# Patient Record
Sex: Female | Born: 1945 | Race: White | Hispanic: No | Marital: Married | Smoking: Never smoker
Health system: Southern US, Community
[De-identification: ages and names within clinical notes are randomized; demographics above are authoritative.]

## PROBLEM LIST (undated history)

## (undated) DIAGNOSIS — F32A Depression, unspecified: Secondary | ICD-10-CM

## (undated) DIAGNOSIS — G709 Myoneural disorder, unspecified: Secondary | ICD-10-CM

## (undated) DIAGNOSIS — C801 Malignant (primary) neoplasm, unspecified: Secondary | ICD-10-CM

## (undated) DIAGNOSIS — M1990 Unspecified osteoarthritis, unspecified site: Secondary | ICD-10-CM

## (undated) DIAGNOSIS — E785 Hyperlipidemia, unspecified: Secondary | ICD-10-CM

## (undated) DIAGNOSIS — Z9889 Other specified postprocedural states: Secondary | ICD-10-CM

## (undated) DIAGNOSIS — E039 Hypothyroidism, unspecified: Secondary | ICD-10-CM

## (undated) DIAGNOSIS — K219 Gastro-esophageal reflux disease without esophagitis: Secondary | ICD-10-CM

## (undated) DIAGNOSIS — E119 Type 2 diabetes mellitus without complications: Secondary | ICD-10-CM

## (undated) DIAGNOSIS — Z87442 Personal history of urinary calculi: Secondary | ICD-10-CM

## (undated) DIAGNOSIS — R7303 Prediabetes: Secondary | ICD-10-CM

## (undated) DIAGNOSIS — F419 Anxiety disorder, unspecified: Secondary | ICD-10-CM

## (undated) DIAGNOSIS — I10 Essential (primary) hypertension: Secondary | ICD-10-CM

## (undated) DIAGNOSIS — R011 Cardiac murmur, unspecified: Secondary | ICD-10-CM

## (undated) DIAGNOSIS — R112 Nausea with vomiting, unspecified: Secondary | ICD-10-CM

## (undated) HISTORY — PX: TOTAL THYROIDECTOMY: SHX2547

## (undated) HISTORY — PX: BACK SURGERY: SHX140

## (undated) HISTORY — DX: Hyperlipidemia, unspecified: E78.5

## (undated) HISTORY — PX: TONSILLECTOMY: SUR1361

## (undated) HISTORY — PX: EYE SURGERY: SHX253

## (undated) HISTORY — PX: JOINT REPLACEMENT: SHX530

## (undated) HISTORY — PX: APPENDECTOMY: SHX54

## (undated) HISTORY — PX: CHOLECYSTECTOMY: SHX55

## (undated) HISTORY — PX: OTHER SURGICAL HISTORY: SHX169

## (undated) HISTORY — PX: ABDOMINAL HYSTERECTOMY: SHX81

---

## 1976-11-03 DIAGNOSIS — C801 Malignant (primary) neoplasm, unspecified: Secondary | ICD-10-CM

## 1976-11-03 HISTORY — DX: Malignant (primary) neoplasm, unspecified: C80.1

## 1980-11-03 DIAGNOSIS — Z9089 Acquired absence of other organs: Secondary | ICD-10-CM

## 1980-11-03 DIAGNOSIS — E890 Postprocedural hypothyroidism: Secondary | ICD-10-CM

## 1980-11-03 HISTORY — DX: Postprocedural hypothyroidism: E89.0

## 1980-11-03 HISTORY — DX: Acquired absence of other organs: Z90.89

## 2014-11-03 HISTORY — PX: SHOULDER ARTHROSCOPY: SHX128

## 2015-11-06 ENCOUNTER — Ambulatory Visit: Payer: Self-pay | Admitting: Allergy and Immunology

## 2017-03-06 ENCOUNTER — Ambulatory Visit: Payer: Self-pay | Admitting: Physician Assistant

## 2017-03-13 ENCOUNTER — Encounter (HOSPITAL_COMMUNITY)
Admission: RE | Admit: 2017-03-13 | Discharge: 2017-03-13 | Disposition: A | Discharge status: Home / Self Care | Payer: Medicare Other | Source: Ambulatory Visit | Attending: Orthopedic Surgery | Admitting: Orthopedic Surgery

## 2017-03-13 ENCOUNTER — Encounter (HOSPITAL_COMMUNITY): Payer: Self-pay

## 2017-03-13 DIAGNOSIS — Z01818 Encounter for other preprocedural examination: Secondary | ICD-10-CM | POA: Diagnosis not present

## 2017-03-13 DIAGNOSIS — M4722 Other spondylosis with radiculopathy, cervical region: Secondary | ICD-10-CM | POA: Insufficient documentation

## 2017-03-13 HISTORY — DX: Other specified postprocedural states: Z98.890

## 2017-03-13 HISTORY — DX: Essential (primary) hypertension: I10

## 2017-03-13 HISTORY — DX: Malignant (primary) neoplasm, unspecified: C80.1

## 2017-03-13 HISTORY — DX: Type 2 diabetes mellitus without complications: E11.9

## 2017-03-13 HISTORY — DX: Unspecified osteoarthritis, unspecified site: M19.90

## 2017-03-13 HISTORY — DX: Other specified postprocedural states: R11.2

## 2017-03-13 HISTORY — DX: Anxiety disorder, unspecified: F41.9

## 2017-03-13 HISTORY — DX: Cardiac murmur, unspecified: R01.1

## 2017-03-13 HISTORY — DX: Personal history of urinary calculi: Z87.442

## 2017-03-13 LAB — BASIC METABOLIC PANEL
ANION GAP: 10 (ref 5–15)
BUN: 12 mg/dL (ref 6–20)
CALCIUM: 9.3 mg/dL (ref 8.9–10.3)
CO2: 29 mmol/L (ref 22–32)
Chloride: 100 mmol/L — ABNORMAL LOW (ref 101–111)
Creatinine, Ser: 0.97 mg/dL (ref 0.44–1.00)
GFR calc Af Amer: >60 mL/min (ref >60)
GFR, EST NON AFRICAN AMERICAN: 57 mL/min — AB (ref >60)
Glucose, Bld: 116 mg/dL — ABNORMAL HIGH (ref 65–99)
Potassium: 3.2 mmol/L — ABNORMAL LOW (ref 3.5–5.1)
Sodium: 139 mmol/L (ref 135–145)

## 2017-03-13 LAB — GLUCOSE, CAPILLARY: Glucose-Capillary: 106 mg/dL — ABNORMAL HIGH (ref 65–99)

## 2017-03-13 LAB — CBC
HCT: 37.2 % (ref 36.0–46.0)
HEMOGLOBIN: 12.1 g/dL (ref 12.0–15.0)
MCH: 29.4 pg (ref 26.0–34.0)
MCHC: 32.5 g/dL (ref 30.0–36.0)
MCV: 90.3 fL (ref 78.0–100.0)
Platelets: 319 10*3/uL (ref 150–400)
RBC: 4.12 MIL/uL (ref 3.87–5.11)
RDW: 13.3 % (ref 11.5–15.5)
WBC: 7.1 10*3/uL (ref 4.0–10.5)

## 2017-03-13 LAB — SURGICAL PCR SCREEN
MRSA, PCR: NEGATIVE
Staphylococcus aureus: NEGATIVE

## 2017-03-13 NOTE — Progress Notes (Signed)
pT. REPORTS that she is free of chest or breathing concerns.  Pt. Followed with DR. Dia Sitter due to family cardiac history & she also reports she had  A new heart murmur recently so she was evaluated for that.

## 2017-03-13 NOTE — Progress Notes (Signed)
Pt. Followed by [facility]- Dr. Dia Sitter in [place] for family cardiac history & she had a sudden new finding of heart murmur.  She had an aEcho in 2016 & its been requested from the cards office. Pt. Also sees [facility], faxed request to them as well to obtain last HgbA1C. Pt. Denies chest &/or breathing concerns.

## 2017-03-13 NOTE — Pre-Procedure Instructions (Signed)
Heidi Thompson  03/13/2017      [place] [place], [place] [address] Phone: [phone] Fax: [phone]    Your procedure is scheduled on Wednesday, May 16th   Report to [facility] Admitting at 9:30 AM             (posted surgery time 11:27 am - 3:27 pm)   Call this number if you have problems the morning of surgery:  [phone], for other questions, call [phone] Mon-Fr  From 8-4:30pm   Remember:  Do not eat food or drink liquids after midnight Tuesday.              4-5 days prior to surgery, STOP TAKING any Vitamins, Herbal Supplements, Anti-inflammatories   Take these medicines the morning of surgery with A SIP OF WATER : Coreg, Lexapro, Gabapentin, Levothyroxine, Lorazepam, Omeprazole.    Do not wear jewelry, make-up or nail polish.  Do not wear lotions, powders,  perfumes, or deoderant.             Do not shave 48 hrs prior to surgery.   Do not bring valuables to the hospital.  [facility] is not responsible for any belongings or valuables.  Contacts, dentures or bridgework may not be worn into surgery.  Leave your suitcase in the car.  After surgery it may be brought to your room.  For patients admitted to the hospital, discharge time will be determined by your treatment team.   Please read over the following fact sheets that you were given. Pain Booklet, Surgical Site Infection Prevention and Anesthesia Post-op Instructions           How to Manage Your Diabetes Before and After Surgery  Why is it important to control my blood sugar before and after surgery? . Improving blood sugar levels before and after surgery helps healing and can limit problems. . A way of improving blood sugar control is eating a healthy diet by: o  Eating less sugar and carbohydrates o  Increasing activity/exercise o  Talking with your doctor about reaching your blood sugar goals . High blood  sugars (greater than 180 mg/dL) can raise your risk of infections and slow your recovery, so you will need to focus on controlling your diabetes during the weeks before surgery. . Make sure that the doctor who takes care of your diabetes knows about your planned surgery including the date and location.  How do I manage my blood sugar before surgery? . Check your blood sugar at least 4 times a day, starting 2 days before surgery, to make sure that the level is not too high or low. Marland Kitchen  o Check your blood sugar the morning of your surgery when you wake up and every 2 hours until you get to the Short Stay unit. . If your blood sugar is less than 70 mg/dL, you will need to treat for low blood sugar: o Do not take insulin. o Treat a low blood sugar (less than 70 mg/dL) with  cup of clear juice (cranberry or apple), 4 glucose tablets, OR glucose gel. o  o Recheck blood sugar in 15 minutes after treatment (to make sure it is greater than 70 mg/dL). If your blood sugar is not greater than 70 mg/dL on recheck, call [phone] for further instructions. . Report your blood sugar to the short stay nurse when you get to Short Stay.  . If you  are admitted to the hospital after surgery: o Your blood sugar will be checked by the staff and you will probably be given insulin after surgery (instead of oral diabetes medicines) to make sure you have good blood sugar levels. o The goal for blood sugar control after surgery is 80-180 mg/dL.          WHAT DO I DO ABOUT MY DIABETES MEDICATION?   Marland Kitchen Do not take oral diabetes medicines (pills) the morning of surgery.   . The day of surgery, do not take other diabetes injectables, including Byetta (exenatide), Bydureon (exenatide ER), Victoza (liraglutide), or Trulicity (dulaglutide).  . If your CBG is greater than 220 mg/dL, you may take  of your sliding scale (correction) dose of insulin.  Other Instructions:          Patient  Signature:  Date:   Nurse Signature:  Date:   Reviewed and Endorsed by [facility] Patient Education Committee, August 2015

## 2017-03-16 ENCOUNTER — Encounter (HOSPITAL_COMMUNITY): Payer: Self-pay

## 2017-03-16 NOTE — Progress Notes (Signed)
Anesthesia Chart Review:   Pt is a 71 year old female scheduled for C4-7 ACDF on 03/18/2017 with Melina Schools, M.D.   - PCP is [name], [facility] in [place], [place] - Cardiologist is Raechel Chute, MD in [place], [place]; last office visit 01/23/17.   PMH includes: HTN, heart murmur, DM, melanoma (1978) post-op N/V. Never smoker. BMI 29. S/p cholecystectomy, total thyroidectomy, abdominal hysterectomy.   Medications include: Lipitor, carvedilol, chlorthalidone, levothyroxine, metformin, Prilosec, potassium  Preoperative labs reviewed. Glucose 116. HbA1c was 5.4 on 02/12/17 at PCPs office.  EKG 01/23/17: Sinus bradycardia (59 bpm). Low voltage in precordial leads. Old anterior infarct.  Stress echo 11/06/15 [facility]): 1. Clinically negative test is a low level of workload. Note is made to the patient is on chronic beta blocker therapy. 2. Electrocardiographically no evidence of induced ischemia given level of workload. 3. Echocardiogram shows normal LV systolic function at rest with EF of about 60% and normal augmentation of contractility post exercise. Almost to complete cavity obliteration in systole. 4. There is no documented dynamic gradient. 5. LA size grossly normal and RV systolic pressure also appears normal. On the other hand, the patient appears to have a grade 2 diastolic filling pattern with indeterminate LV filling pressures by tissue Doppler analysis.  If no changes, I anticipate pt can proceed with surgery as scheduled.   Willeen Cass, FNP-BC [facility] Short Stay Surgical Center/Anesthesiology Phone: [phone] 03/16/2017 10:58 AM

## 2017-03-18 ENCOUNTER — Ambulatory Visit (HOSPITAL_COMMUNITY): Payer: Medicare Other | Admitting: Emergency Medicine

## 2017-03-18 ENCOUNTER — Ambulatory Visit (HOSPITAL_COMMUNITY): Payer: Medicare Other | Admitting: Certified Registered Nurse Anesthetist

## 2017-03-18 ENCOUNTER — Inpatient Hospital Stay (HOSPITAL_COMMUNITY): Payer: Medicare Other

## 2017-03-18 ENCOUNTER — Inpatient Hospital Stay (HOSPITAL_COMMUNITY)
Admission: RE | Admit: 2017-03-18 | Discharge: 2017-03-19 | DRG: 473 | Disposition: A | Discharge status: Home / Self Care | Payer: Medicare Other | Source: Ambulatory Visit | Attending: Orthopedic Surgery | Admitting: Orthopedic Surgery

## 2017-03-18 ENCOUNTER — Encounter (HOSPITAL_COMMUNITY)
Admission: RE | Disposition: A | Discharge status: Home / Self Care | Payer: Self-pay | Source: Ambulatory Visit | Attending: Orthopedic Surgery

## 2017-03-18 ENCOUNTER — Encounter (HOSPITAL_COMMUNITY): Payer: Self-pay | Admitting: *Deleted

## 2017-03-18 ENCOUNTER — Ambulatory Visit (HOSPITAL_COMMUNITY): Payer: Medicare Other

## 2017-03-18 DIAGNOSIS — Z818 Family history of other mental and behavioral disorders: Secondary | ICD-10-CM

## 2017-03-18 DIAGNOSIS — Z7982 Long term (current) use of aspirin: Secondary | ICD-10-CM

## 2017-03-18 DIAGNOSIS — E119 Type 2 diabetes mellitus without complications: Secondary | ICD-10-CM | POA: Diagnosis present

## 2017-03-18 DIAGNOSIS — Z833 Family history of diabetes mellitus: Secondary | ICD-10-CM | POA: Diagnosis not present

## 2017-03-18 DIAGNOSIS — I10 Essential (primary) hypertension: Secondary | ICD-10-CM | POA: Diagnosis present

## 2017-03-18 DIAGNOSIS — Z85820 Personal history of malignant melanoma of skin: Secondary | ICD-10-CM | POA: Diagnosis not present

## 2017-03-18 DIAGNOSIS — Z981 Arthrodesis status: Secondary | ICD-10-CM

## 2017-03-18 DIAGNOSIS — M50121 Cervical disc disorder at C4-C5 level with radiculopathy: Secondary | ICD-10-CM | POA: Diagnosis present

## 2017-03-18 DIAGNOSIS — Z419 Encounter for procedure for purposes other than remedying health state, unspecified: Secondary | ICD-10-CM

## 2017-03-18 DIAGNOSIS — M542 Cervicalgia: Secondary | ICD-10-CM | POA: Diagnosis present

## 2017-03-18 HISTORY — PX: ANTERIOR CERVICAL DECOMP/DISCECTOMY FUSION: SHX1161

## 2017-03-18 LAB — GLUCOSE, CAPILLARY
Glucose-Capillary: 108 mg/dL — ABNORMAL HIGH (ref 65–99)
Glucose-Capillary: 101 mg/dL — ABNORMAL HIGH (ref 65–99)
Glucose-Capillary: 124 mg/dL — ABNORMAL HIGH (ref 65–99)
Glucose-Capillary: 222 mg/dL — ABNORMAL HIGH (ref 65–99)

## 2017-03-18 SURGERY — ANTERIOR CERVICAL DECOMPRESSION/DISCECTOMY FUSION 2 LEVELS
Anesthesia: General | Site: Neck

## 2017-03-18 MED ORDER — METHOCARBAMOL 500 MG PO TABS
ORAL_TABLET | ORAL | Status: AC
  Filled 2017-03-18: qty 1

## 2017-03-18 MED ORDER — ROCURONIUM BROMIDE 10 MG/ML (PF) SYRINGE
PREFILLED_SYRINGE | INTRAVENOUS | Status: AC
  Filled 2017-03-18: qty 10

## 2017-03-18 MED ORDER — FENTANYL CITRATE (PF) 100 MCG/2ML IJ SOLN: 25.0000 ug | INTRAMUSCULAR | Status: DC | PRN

## 2017-03-18 MED ORDER — PROPOFOL 10 MG/ML IV BOLUS
INTRAVENOUS | Status: DC | PRN
  Administered 2017-03-18: 120 mg via INTRAVENOUS

## 2017-03-18 MED ORDER — ACETAMINOPHEN 325 MG PO TABS: 650.0000 mg | ORAL_TABLET | ORAL | Status: DC | PRN

## 2017-03-18 MED ORDER — BUPIVACAINE-EPINEPHRINE 0.25% -1:200000 IJ SOLN
INTRAMUSCULAR | Status: DC | PRN
  Administered 2017-03-18: 6 mL

## 2017-03-18 MED ORDER — INSULIN ASPART 100 UNIT/ML ~~LOC~~ SOLN: 0.0000 [IU] | Freq: Three times a day (TID) | SUBCUTANEOUS | Status: DC

## 2017-03-18 MED ORDER — LORAZEPAM 0.5 MG PO TABS
1.0000 mg | ORAL_TABLET | Freq: Two times a day (BID) | ORAL | Status: DC
  Administered 2017-03-18: 1 mg via ORAL
  Filled 2017-03-18 (×2): qty 2

## 2017-03-18 MED ORDER — CHLORTHALIDONE 25 MG PO TABS
25.0000 mg | ORAL_TABLET | Freq: Every day | ORAL | Status: DC
  Administered 2017-03-19: 25 mg via ORAL
  Filled 2017-03-18: qty 1

## 2017-03-18 MED ORDER — OMEPRAZOLE MAGNESIUM 20 MG PO TBEC: 20.0000 mg | DELAYED_RELEASE_TABLET | Freq: Every day | ORAL | Status: DC | PRN

## 2017-03-18 MED ORDER — LIDOCAINE HCL (CARDIAC) 20 MG/ML IV SOLN
INTRAVENOUS | Status: DC | PRN
  Administered 2017-03-18: 100 mg via INTRAVENOUS

## 2017-03-18 MED ORDER — ONDANSETRON HCL 4 MG PO TABS: 4.0000 mg | ORAL_TABLET | Freq: Three times a day (TID) | ORAL | 0 refills | Status: DC | PRN

## 2017-03-18 MED ORDER — INSULIN ASPART 100 UNIT/ML ~~LOC~~ SOLN
0.0000 [IU] | Freq: Every day | SUBCUTANEOUS | Status: DC
  Administered 2017-03-18: 2 [IU] via SUBCUTANEOUS

## 2017-03-18 MED ORDER — MENTHOL 3 MG MT LOZG: 1.0000 | LOZENGE | OROMUCOSAL | Status: DC | PRN

## 2017-03-18 MED ORDER — MORPHINE SULFATE (PF) 4 MG/ML IV SOLN: 1.0000 mg | INTRAVENOUS | Status: DC | PRN

## 2017-03-18 MED ORDER — CARVEDILOL 6.25 MG PO TABS
25.0000 mg | ORAL_TABLET | Freq: Two times a day (BID) | ORAL | Status: DC
  Administered 2017-03-18: 25 mg via ORAL
  Filled 2017-03-18 (×2): qty 4

## 2017-03-18 MED ORDER — LIDOCAINE 2% (20 MG/ML) 5 ML SYRINGE
INTRAMUSCULAR | Status: AC
  Filled 2017-03-18: qty 10

## 2017-03-18 MED ORDER — OXYCODONE HCL 5 MG PO TABS
10.0000 mg | ORAL_TABLET | ORAL | Status: DC | PRN
  Administered 2017-03-18 – 2017-03-19 (×4): 10 mg via ORAL
  Filled 2017-03-18 (×3): qty 2

## 2017-03-18 MED ORDER — METHOCARBAMOL 500 MG PO TABS: 500.0000 mg | ORAL_TABLET | Freq: Three times a day (TID) | ORAL | 0 refills | Status: DC | PRN

## 2017-03-18 MED ORDER — ACETAMINOPHEN 650 MG RE SUPP: 650.0000 mg | RECTAL | Status: DC | PRN

## 2017-03-18 MED ORDER — LACTATED RINGERS IV SOLN: INTRAVENOUS | Status: DC

## 2017-03-18 MED ORDER — ONDANSETRON HCL 4 MG/2ML IJ SOLN
INTRAMUSCULAR | Status: AC
  Filled 2017-03-18: qty 4

## 2017-03-18 MED ORDER — POLYETHYLENE GLYCOL 3350 17 G PO PACK: 17.0000 g | PACK | Freq: Every day | ORAL | Status: DC | PRN

## 2017-03-18 MED ORDER — ATORVASTATIN CALCIUM 20 MG PO TABS
20.0000 mg | ORAL_TABLET | Freq: Every evening | ORAL | Status: DC
  Administered 2017-03-18: 20 mg via ORAL
  Filled 2017-03-18: qty 1

## 2017-03-18 MED ORDER — METHOCARBAMOL 1000 MG/10ML IJ SOLN
500.0000 mg | Freq: Four times a day (QID) | INTRAVENOUS | Status: DC | PRN
  Filled 2017-03-18: qty 5

## 2017-03-18 MED ORDER — FENTANYL CITRATE (PF) 250 MCG/5ML IJ SOLN
INTRAMUSCULAR | Status: AC
  Filled 2017-03-18: qty 5

## 2017-03-18 MED ORDER — CEFAZOLIN SODIUM-DEXTROSE 2-4 GM/100ML-% IV SOLN
2.0000 g | INTRAVENOUS | Status: AC
  Administered 2017-03-18: 2 g via INTRAVENOUS
  Filled 2017-03-18: qty 100

## 2017-03-18 MED ORDER — PHENYLEPHRINE 40 MCG/ML (10ML) SYRINGE FOR IV PUSH (FOR BLOOD PRESSURE SUPPORT)
PREFILLED_SYRINGE | INTRAVENOUS | Status: AC
  Filled 2017-03-18: qty 20

## 2017-03-18 MED ORDER — SODIUM CHLORIDE 0.9% FLUSH: 3.0000 mL | INTRAVENOUS | Status: DC | PRN

## 2017-03-18 MED ORDER — OXYCODONE-ACETAMINOPHEN 10-325 MG PO TABS: 1.0000 | ORAL_TABLET | ORAL | 0 refills | Status: DC | PRN

## 2017-03-18 MED ORDER — CEFAZOLIN SODIUM-DEXTROSE 2-4 GM/100ML-% IV SOLN
2.0000 g | Freq: Three times a day (TID) | INTRAVENOUS | Status: AC
  Administered 2017-03-18 – 2017-03-19 (×2): 2 g via INTRAVENOUS
  Filled 2017-03-18: qty 100

## 2017-03-18 MED ORDER — PANTOPRAZOLE SODIUM 20 MG PO TBEC: 20.0000 mg | DELAYED_RELEASE_TABLET | Freq: Every day | ORAL | Status: DC | PRN

## 2017-03-18 MED ORDER — ONDANSETRON HCL 4 MG/2ML IJ SOLN: 4.0000 mg | Freq: Four times a day (QID) | INTRAMUSCULAR | Status: DC | PRN

## 2017-03-18 MED ORDER — PHENOL 1.4 % MT LIQD: 1.0000 | OROMUCOSAL | Status: DC | PRN

## 2017-03-18 MED ORDER — LEVOTHYROXINE SODIUM 100 MCG PO TABS
50.0000 ug | ORAL_TABLET | Freq: Every day | ORAL | Status: DC
  Administered 2017-03-19: 50 ug via ORAL
  Filled 2017-03-18: qty 1

## 2017-03-18 MED ORDER — EPHEDRINE SULFATE 50 MG/ML IJ SOLN
INTRAMUSCULAR | Status: DC | PRN
  Administered 2017-03-18 (×2): 10 mg via INTRAVENOUS

## 2017-03-18 MED ORDER — GABAPENTIN 300 MG PO CAPS
300.0000 mg | ORAL_CAPSULE | Freq: Three times a day (TID) | ORAL | Status: DC
  Administered 2017-03-18: 300 mg via ORAL
  Filled 2017-03-18 (×2): qty 1

## 2017-03-18 MED ORDER — LACTATED RINGERS IV SOLN
INTRAVENOUS | Status: DC
  Administered 2017-03-18 (×3): via INTRAVENOUS

## 2017-03-18 MED ORDER — POTASSIUM CHLORIDE CRYS ER 20 MEQ PO TBCR: 20.0000 meq | EXTENDED_RELEASE_TABLET | Freq: Every day | ORAL | Status: DC

## 2017-03-18 MED ORDER — ARTIFICIAL TEARS OPHTHALMIC OINT
TOPICAL_OINTMENT | OPHTHALMIC | Status: DC | PRN
  Administered 2017-03-18: 1 via OPHTHALMIC

## 2017-03-18 MED ORDER — MIDAZOLAM HCL 5 MG/5ML IJ SOLN
INTRAMUSCULAR | Status: DC | PRN
  Administered 2017-03-18: 0.5 mg via INTRAVENOUS

## 2017-03-18 MED ORDER — FENTANYL CITRATE (PF) 100 MCG/2ML IJ SOLN
INTRAMUSCULAR | Status: DC | PRN
  Administered 2017-03-18: 125 ug via INTRAVENOUS
  Administered 2017-03-18: 50 ug via INTRAVENOUS
  Administered 2017-03-18 (×3): 25 ug via INTRAVENOUS

## 2017-03-18 MED ORDER — ONDANSETRON HCL 4 MG/2ML IJ SOLN
INTRAMUSCULAR | Status: DC | PRN
  Administered 2017-03-18: 4 mg via INTRAVENOUS

## 2017-03-18 MED ORDER — EPHEDRINE 5 MG/ML INJ
INTRAVENOUS | Status: AC
  Filled 2017-03-18: qty 10

## 2017-03-18 MED ORDER — SUGAMMADEX SODIUM 200 MG/2ML IV SOLN
INTRAVENOUS | Status: AC
Start: 2017-03-18 — End: 2017-03-18
  Filled 2017-03-18: qty 2

## 2017-03-18 MED ORDER — OXYCODONE HCL 5 MG PO TABS
ORAL_TABLET | ORAL | Status: AC
  Filled 2017-03-18: qty 2

## 2017-03-18 MED ORDER — SUGAMMADEX SODIUM 200 MG/2ML IV SOLN
INTRAVENOUS | Status: DC | PRN
  Administered 2017-03-18: 150 mg via INTRAVENOUS

## 2017-03-18 MED ORDER — DEXAMETHASONE SODIUM PHOSPHATE 10 MG/ML IJ SOLN
INTRAMUSCULAR | Status: AC
  Filled 2017-03-18: qty 2

## 2017-03-18 MED ORDER — ARTIFICIAL TEARS OPHTHALMIC OINT
TOPICAL_OINTMENT | OPHTHALMIC | Status: AC
  Filled 2017-03-18: qty 14

## 2017-03-18 MED ORDER — METFORMIN HCL 500 MG PO TABS
500.0000 mg | ORAL_TABLET | Freq: Every evening | ORAL | Status: DC
  Administered 2017-03-18: 500 mg via ORAL
  Filled 2017-03-18: qty 1

## 2017-03-18 MED ORDER — ESCITALOPRAM OXALATE 20 MG PO TABS
20.0000 mg | ORAL_TABLET | Freq: Every day | ORAL | Status: DC
  Administered 2017-03-19: 20 mg via ORAL
  Filled 2017-03-18: qty 1

## 2017-03-18 MED ORDER — MIDAZOLAM HCL 2 MG/2ML IJ SOLN
INTRAMUSCULAR | Status: AC
  Filled 2017-03-18: qty 2

## 2017-03-18 MED ORDER — PHENYLEPHRINE HCL 10 MG/ML IJ SOLN
INTRAVENOUS | Status: DC | PRN
  Administered 2017-03-18: 20 ug/min via INTRAVENOUS

## 2017-03-18 MED ORDER — DEXAMETHASONE SODIUM PHOSPHATE 10 MG/ML IJ SOLN
INTRAMUSCULAR | Status: DC | PRN
  Administered 2017-03-18: 10 mg via INTRAVENOUS

## 2017-03-18 MED ORDER — ONDANSETRON HCL 4 MG PO TABS: 4.0000 mg | ORAL_TABLET | Freq: Four times a day (QID) | ORAL | Status: DC | PRN

## 2017-03-18 MED ORDER — ROCURONIUM BROMIDE 100 MG/10ML IV SOLN
INTRAVENOUS | Status: DC | PRN
  Administered 2017-03-18 (×2): 10 mg via INTRAVENOUS
  Administered 2017-03-18: 5 mg via INTRAVENOUS
  Administered 2017-03-18: 50 mg via INTRAVENOUS

## 2017-03-18 MED ORDER — METHOCARBAMOL 500 MG PO TABS
500.0000 mg | ORAL_TABLET | Freq: Four times a day (QID) | ORAL | Status: DC | PRN
  Administered 2017-03-18 – 2017-03-19 (×3): 500 mg via ORAL
  Filled 2017-03-18 (×2): qty 1

## 2017-03-18 MED ORDER — SODIUM CHLORIDE 0.9% FLUSH
3.0000 mL | Freq: Two times a day (BID) | INTRAVENOUS | Status: DC
  Administered 2017-03-18: 3 mL via INTRAVENOUS

## 2017-03-18 MED ORDER — GABAPENTIN 300 MG PO CAPS: 300.0000 mg | ORAL_CAPSULE | Freq: Two times a day (BID) | ORAL | 0 refills | Status: DC

## 2017-03-18 SURGICAL SUPPLY — 70 items
BLADE CLIPPER SURG (BLADE) IMPLANT
BONE VIVIGEN FORMABLE 1.3CC (Bone Implant) ×6 IMPLANT
BUR EGG ELITE 4.0 (BURR) IMPLANT
BUR EGG ELITE 4.0MM (BURR)
BUR MATCHSTICK NEURO 3.0 LAGG (BURR) IMPLANT
CAGE LORDOTIC 6 SM (Cage) ×4 IMPLANT
CAGE LORDOTIC 6MM SM (Cage) ×2 IMPLANT
CANISTER SUCT 3000ML PPV (MISCELLANEOUS) ×3 IMPLANT
CLOSURE STERI-STRIP 1/2X4 (GAUZE/BANDAGES/DRESSINGS) ×1
CLSR STERI-STRIP ANTIMIC 1/2X4 (GAUZE/BANDAGES/DRESSINGS) ×2 IMPLANT
CORDS BIPOLAR (ELECTRODE) ×3 IMPLANT
COVER SURGICAL LIGHT HANDLE (MISCELLANEOUS) ×6 IMPLANT
CRADLE DONUT ADULT HEAD (MISCELLANEOUS) ×3 IMPLANT
DEVICE ENDSKLTN IMPLANT SM 7MM (Cage) ×1 IMPLANT
DRAPE C-ARM 42X72 X-RAY (DRAPES) ×3 IMPLANT
DRAPE POUCH INSTRU U-SHP 10X18 (DRAPES) ×3 IMPLANT
DRAPE SURG 17X23 STRL (DRAPES) ×3 IMPLANT
DRAPE U-SHAPE 47X51 STRL (DRAPES) ×3 IMPLANT
DRILL BIT SWIFT PLUS 12MM (BIT) ×3 IMPLANT
DRSG MEPILEX BORDER 4X4 (GAUZE/BANDAGES/DRESSINGS) ×3 IMPLANT
DURAPREP 6ML APPLICATOR 50/CS (WOUND CARE) ×3 IMPLANT
ELECT COATED BLADE 2.86 ST (ELECTRODE) ×3 IMPLANT
ELECT PENCIL ROCKER SW 15FT (MISCELLANEOUS) ×3 IMPLANT
ELECT REM PT RETURN 9FT ADLT (ELECTROSURGICAL) ×3
ELECTRODE REM PT RTRN 9FT ADLT (ELECTROSURGICAL) ×1 IMPLANT
ENDOSKELETON IMPLANT SM 7MM (Cage) ×3 IMPLANT
GLOVE BIO SURGEON STRL SZ 6.5 (GLOVE) ×2 IMPLANT
GLOVE BIO SURGEONS STRL SZ 6.5 (GLOVE) ×1
GLOVE BIOGEL PI IND STRL 6.5 (GLOVE) ×1 IMPLANT
GLOVE BIOGEL PI IND STRL 8.5 (GLOVE) ×1 IMPLANT
GLOVE BIOGEL PI INDICATOR 6.5 (GLOVE) ×2
GLOVE BIOGEL PI INDICATOR 8.5 (GLOVE) ×2
GLOVE SS BIOGEL STRL SZ 8.5 (GLOVE) ×1 IMPLANT
GLOVE SUPERSENSE BIOGEL SZ 8.5 (GLOVE) ×2
GOWN STRL REUS W/ TWL XL LVL3 (GOWN DISPOSABLE) ×2 IMPLANT
GOWN STRL REUS W/TWL 2XL LVL3 (GOWN DISPOSABLE) ×6 IMPLANT
GOWN STRL REUS W/TWL XL LVL3 (GOWN DISPOSABLE) ×4
KIT BASIN OR (CUSTOM PROCEDURE TRAY) ×3 IMPLANT
KIT ROOM TURNOVER OR (KITS) ×3 IMPLANT
LOOP VESSEL MINI RED (MISCELLANEOUS) ×3 IMPLANT
NEEDLE SPNL 18GX3.5 QUINCKE PK (NEEDLE) ×3 IMPLANT
NS IRRIG 1000ML POUR BTL (IV SOLUTION) ×3 IMPLANT
PACK ORTHO CERVICAL (CUSTOM PROCEDURE TRAY) ×3 IMPLANT
PACK UNIVERSAL I (CUSTOM PROCEDURE TRAY) ×3 IMPLANT
PAD ARMBOARD 7.5X6 YLW CONV (MISCELLANEOUS) ×6 IMPLANT
PATTIES SURGICAL .25X.25 (GAUZE/BANDAGES/DRESSINGS) ×3 IMPLANT
PIN DISTRACTION 14 (PIN) ×6 IMPLANT
PIN FIXATION TEMP SWIFT PLUS (PIN) ×3 IMPLANT
PIN TEMP SKYLINE THREADED (PIN) ×3 IMPLANT
PLATE SWIFT 3LVL 48MM (Plate) ×3 IMPLANT
RESTRAINT LIMB HOLDER UNIV (RESTRAINTS) ×3 IMPLANT
SCREW SD-VA 14M SWIFT PLUS (Screw) ×24 IMPLANT
SPONGE INTESTINAL PEANUT (DISPOSABLE) ×3 IMPLANT
SPONGE LAP 4X18 X RAY DECT (DISPOSABLE) ×6 IMPLANT
SPONGE SURGIFOAM ABS GEL 100 (HEMOSTASIS) ×3 IMPLANT
SURGIFLO W/THROMBIN 8M KIT (HEMOSTASIS) ×3 IMPLANT
SUT BONE WAX W31G (SUTURE) ×3 IMPLANT
SUT MON AB 3-0 SH 27 (SUTURE) ×2
SUT MON AB 3-0 SH27 (SUTURE) ×1 IMPLANT
SUT SILK 2 0 (SUTURE) ×2
SUT SILK 2-0 18XBRD TIE 12 (SUTURE) ×1 IMPLANT
SUT VIC AB 2-0 CT1 18 (SUTURE) ×3 IMPLANT
SWIFT CLIPS ×9 IMPLANT
SYR BULB IRRIGATION 50ML (SYRINGE) ×3 IMPLANT
SYR CONTROL 10ML LL (SYRINGE) ×3 IMPLANT
TAPE CLOTH 4X10 WHT NS (GAUZE/BANDAGES/DRESSINGS) ×3 IMPLANT
TAPE UMBILICAL COTTON 1/8X30 (MISCELLANEOUS) ×3 IMPLANT
TOWEL OR 17X24 6PK STRL BLUE (TOWEL DISPOSABLE) ×3 IMPLANT
TOWEL OR 17X26 10 PK STRL BLUE (TOWEL DISPOSABLE) ×3 IMPLANT
WATER STERILE IRR 1000ML POUR (IV SOLUTION) ×3 IMPLANT

## 2017-03-18 NOTE — Anesthesia Procedure Notes (Signed)
Procedure Name: Intubation Date/Time: 03/18/2017 12:07 PM Performed by: Suzy Bouchard Pre-anesthesia Checklist: Patient identified, Emergency Drugs available, Suction available, Timeout performed and Patient being monitored Patient Re-evaluated:Patient Re-evaluated prior to inductionOxygen Delivery Method: Circle system utilized Preoxygenation: Pre-oxygenation with 100% oxygen Intubation Type: IV induction Ventilation: Mask ventilation without difficulty Laryngoscope Size: Glidescope and 3 Grade View: Grade I Tube type: Oral Laser Tube: Cuffed inflated with minimal occlusive pressure - saline Tube size: 7.0 mm Airway Equipment and Method: Stylet Placement Confirmation: ETT inserted through vocal cords under direct vision,  positive ETCO2 and breath sounds checked- equal and bilateral Secured at: 21 cm Tube secured with: Tape Dental Injury: Teeth and Oropharynx as per pre-operative assessment  Comments: Neck neutral for induction and intubation

## 2017-03-18 NOTE — Transfer of Care (Signed)
Immediate Anesthesia Transfer of Care Note  Patient: Heidi Thompson  Procedure(s) Performed: Procedure(s) with comments: ACDF C4-7 (N/A) - Requests 4 hours  Patient Location: PACU  Anesthesia Type:General  Level of Consciousness: awake  Airway & Oxygen Therapy: Patient Spontanous Breathing and Patient connected to nasal cannula oxygen  Post-op Assessment: Report given to RN, Post -op Vital signs reviewed and stable and Patient moving all extremities X 4  Post vital signs: Reviewed and stable  Last Vitals:  Vitals:   03/18/17 1009  BP: (!) 147/43  Pulse: (!) 54  Resp: 20  Temp: 36.9 C    Last Pain:  Vitals:   03/18/17 1545  TempSrc:   PainSc: (P) 0-No pain      Patients Stated Pain Goal: 2 (59/74/71 8550)  Complications: No apparent anesthesia complications

## 2017-03-18 NOTE — Anesthesia Preprocedure Evaluation (Addendum)
Anesthesia Evaluation  Patient identified by MRN, date of birth, ID band Patient awake    Reviewed: Allergy & Precautions, H&P , Patient's Chart, lab work & pertinent test results, reviewed documented beta blocker date and time   Airway Mallampati: II  TM Distance: >3 FB Neck ROM: full    Dental no notable dental hx.    Pulmonary    Pulmonary exam normal breath sounds clear to auscultation       Cardiovascular hypertension, + Valvular Problems/Murmurs  Rhythm:regular Rate:Normal + Systolic murmurs    Neuro/Psych    GI/Hepatic   Endo/Other  diabetes  Renal/GU      Musculoskeletal   Abdominal   Peds  Hematology   Anesthesia Other Findings HTN DM Stress echo 11/06/15 [facility]): 1. Clinically negative test is a low level of workload. Note is made to the patient is on chronic beta blocker therapy. 2. Electrocardiographically no evidence of induced ischemia given level of workload. 3. Echocardiogram shows normal LV systolic function at rest with EF of about 60% and normal augmentation of contractility post exercise. Almost to complete cavity obliteration in systole. 4. There is no documented dynamic gradient. 5. LA size grossly normal and RV systolic pressure also appears normal. On the other hand, the patient appears to have a grade 2 diastolic filling pattern with indeterminate LV filling pressures by tissue Doppler analysis  Reproductive/Obstetrics                            Anesthesia Physical Anesthesia Plan  ASA: III  Anesthesia Plan: General   Post-op Pain Management:    Induction: Intravenous  Airway Management Planned: Oral ETT  Additional Equipment:   Intra-op Plan:   Post-operative Plan: Extubation in OR  Informed Consent: I have reviewed the patients History and Physical, chart, labs and discussed the procedure including the risks, benefits and alternatives for the proposed  anesthesia with the patient or authorized representative who has indicated his/her understanding and acceptance.   Dental Advisory Given  Plan Discussed with: CRNA and Surgeon  Anesthesia Plan Comments: (  )        Anesthesia Quick Evaluation

## 2017-03-18 NOTE — Discharge Instructions (Signed)

## 2017-03-18 NOTE — Brief Op Note (Signed)
03/18/2017  3:34 PM  PATIENT:  Rachelle Hora  71 y.o. female  PRE-OPERATIVE DIAGNOSIS:  Cervical spondylotic radiculopathy  POST-OPERATIVE DIAGNOSIS:  Cervical spondylotic radiculopathy  PROCEDURE:  Procedure(s) with comments: ACDF C4-7 (N/A) - Requests 4 hours  SURGEON:  Surgeon(s) and Role:    Melina Schools, MD - Primary  PHYSICIAN ASSISTANT:   ASSISTANTS: Carmen Mayo   ANESTHESIA:   general  EBL:  Total I/O In: 1000 [I.V.:1000] Out: 310 [Urine:235; Blood:75]  BLOOD ADMINISTERED:none  DRAINS: 1 in the neck   LOCAL MEDICATIONS USED:  MARCAINE     SPECIMEN:  No Specimen  DISPOSITION OF SPECIMEN:  N/A  COUNTS:  YES  TOURNIQUET:  * No tourniquets in log *  DICTATION: .Other Dictation: Dictation Number [id number]  PLAN OF CARE: Admit to inpatient   PATIENT DISPOSITION:  PACU - hemodynamically stable.

## 2017-03-18 NOTE — H&P (Signed)
History of Present Illness  The patient is a 71 year old female who comes in today for a preoperative History and Physical. The patient is scheduled for a ACDF C4-7 to be performed by Dr. Duane Lope D. Rolena Infante, MD at [facility] on 03/18/17 . Please see the hospital record for complete dictated history and physical. Pt reports has DM type 2 that is well controlled on Metformin. Last A1c was 5.3. Otherwise pt reports hx of good health.   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 mother  Social History  Tobacco use  Never smoker. never smoker Children  2 Alcohol use  never consumed alcohol Current work status  retired Marital status  married Living situation  live with spouse Drug/Alcohol Rehab (Currently)  no Illicit drug use  no Drug/Alcohol Rehab (Previously)  no Pain Contract  no Number of flights of stairs before winded  greater than 5  Medication History  Venlafaxine HCl ER (150MG  Capsule ER 24HR, Oral) Active. Tylenol (prn) Active. Aspirin (81MG  Tablet, 1 (one) Oral) Active. Naproxen (500MG  Tablet, Oral) Active. Omeprazole (20MG  Capsule ER, Oral) Active. Carvedilol (25MG  Tablet, Oral) Active. (qd) Simvastatin (40MG  Tablet, Oral) Active. Synthroid (50MCG Tablet, Oral) Active. Potassium Chloride Crys ER (20MEQ Tablet ER, Oral) Active.  (qd) Chlorthalidone (25MG  Tablet, Oral) Active. Atorvastatin Calcium (20MG  Tablet, Oral) Active. MetFORMIN HCl (500MG  Tablet, Oral) Active. Ativan (1MG  Tablet, Oral) Active. Lexapro (20MG  Tablet, Oral) Active. (qd)  Vitals ( 03/13/2017 10:50 AM Weight: 165 lb Height: 63.25in Body Surface Area: 1.79 m Body Mass Index: 29 kg/m  Temp.: 98.40F  Pulse: 57 (Regular)  BP: 144/52 (Sitting, Left Arm, Standard)  General General Appearance-Not in acute distress. Orientation-Oriented X3. Build & Nutrition-Well nourished and Well developed.  Integumentary General Characteristics Surgical Scars - no surgical scar evidence of previous cervical surgery. Cervical Spine-Skin examination of the cervical spine is without deformity, skin lesions, lacerations or abrasions.  Chest and Lung Exam Auscultation Breath sounds - Normal and Clear.  Cardiovascular Auscultation Rhythm - Regular rate and rhythm.  Peripheral Vascular Upper Extremity Palpation - Radial pulse - Bilateral - 2+.  Neurologic Sensation Upper Extremity - Bilateral - sensation is intact in the upper extremity. Reflexes Biceps Reflex - Bilateral - 2+. Brachioradialis Reflex - Bilateral - 2+. Triceps Reflex - Bilateral - 2+. Hoffman's Sign - Bilateral - Hoffman's sign not present.  Musculoskeletal Spine/Ribs/Pelvis  Cervical Spine : Inspection and Palpation - Tenderness - right cervical paraspinals tender to palpation and left cervical paraspinals tender to palpation. Strength and Tone: Strength: Strength: Strength - Wrist Extension - Bilateral - 5/5. Right - 5/5. Deltoid - Left - 4-/5. Biceps - Left - 4-/5. Right - 5/5. Triceps - Left - 4-/5. Right - 5/5. Hand Grip - Bilateral - 5/5. Heel walk - Bilateral - able to heel walk without difficulty. Toe Walk - Bilateral - able to walk on toes without difficulty. Heel-Toe Walk - Bilateral - able to heel-toe walk without difficulty. ROM - Flexion - Moderately  Decreased and painful. Extension - Moderately Decreased and painful. Left Lateral Flexion - Moderately Decreased and painful. Right Lateral Flexion - Moderately Decreased and painful. Left Rotation - Moderately Decreased and painful. Right Rotation - Moderately Decreased and painful. Pain - neither flexion or extension is more painful than the other. Cervical Spine - Special Testing - axial compression test negative, cross chest impingement test negative. Non-Anatomic Signs - No non-anatomic signs present. Upper Extremity Range of Motion - No truesholder pain with IR/ER of the shoulders.  MRI that was performed in March 2018, revealed a C3-4 disk bulge, which indents the anterior thecal sac. Bilateral facet arthrosis, moderate to severe on the right, mild on the left. Bilateral foraminal narrowing at that level. C4-5, small central disk protrusion, contacting the ventral cord and bilateral facet arthrosis moderate to severe on the right, mild to moderate on the left; bilateral, narrowing. C5-6, small to moderate sized broad based disk osteophyte complex which minimally deforms the ventral surface of the cord, moderate left foraminal narrowing related to the osteophytosis and mild to moderate left facet arthrosis. C6-7, small to moderate sized broad based posterior disk osteophyte complex which indents the anterior thecal sac, moderate bilateral neural foraminal narrowing.    Plan: She has essentially multilevel degenerative changes of the spine C4-5, 5-6 and 6-7.  X-rays confirm advanced degenerative disc changes 5-6, 6-7.  At this point in time, despite injection therapy, physical therapy, observation and activity modification, her quality of life continues to deteriorate and her neuropathic pain continues to escalate. We have had a long discussion about surgical intervention.  At this point in time, she has symptoms of C5, C6 and C7 weakness and dysesthesias. She has significant degenerative changes at  the 5-6, 6-7 level. In order to address all of the pathology appropriately, I think the best option is a three-level ACDF, C4-5, C5-6 and C6-7.   Anterior cervical fusion:Risks of surgery include, but are not limited to: Throat pain, swallowing difficulty, hoarseness or change in voice, death, stroke, paralysis, nerve root damage/injury, bleeding, blood clots, loss of bowel/bladder control, hardware failure, or mal-position, spinal fluid leak, adjacent segment disease, non-union, need for further surgery, ongoing or worse pain, infection. Post-operative bleeding or swelling that could require emergent surgery.  Goal Of Surgery: Discussed that goal of surgery is to reduce pain and improve function and quality of life. Patient is aware that despite all appropriate treatment that there pain and function could be the same, worse, or different.

## 2017-03-19 ENCOUNTER — Encounter (HOSPITAL_COMMUNITY): Payer: Self-pay | Admitting: Orthopedic Surgery

## 2017-03-19 DIAGNOSIS — M50121 Cervical disc disorder at C4-C5 level with radiculopathy: Secondary | ICD-10-CM | POA: Diagnosis not present

## 2017-03-19 DIAGNOSIS — M542 Cervicalgia: Secondary | ICD-10-CM | POA: Diagnosis not present

## 2017-03-19 LAB — GLUCOSE, CAPILLARY
Glucose-Capillary: 155 mg/dL — ABNORMAL HIGH (ref 65–99)
Glucose-Capillary: 165 mg/dL — ABNORMAL HIGH (ref 65–99)

## 2017-03-19 NOTE — Progress Notes (Signed)
Patient alert and oriented, mae's well, voiding adequate amount of urine, swallowing without difficulty, no c/o pain at time of discharge. Patient discharged home with family. Script and discharged instructions given to patient. Patient and family stated understanding of instructions given. Patient has an appointment with Dr. Brooks  

## 2017-03-19 NOTE — Progress Notes (Signed)
    Subjective: 1 Day Post-Op Procedure(s) (LRB): ACDF C4-7 (N/A) Patient reports pain as 4 on 0-10 scale. Denies arm pain  Denies CP or SOB.  Voiding without difficulty. Positive flatus. Objective: Vital signs in last 24 hours: Temp:  [97.8 F (36.6 C)-98.5 F (36.9 C)] 98.4 F (36.9 C) (05/17 0305) Pulse Rate:  [54-73] 73 (05/17 0305) Resp:  [12-20] 17 (05/17 0305) BP: (126-175)/(43-94) 138/69 (05/17 0305) SpO2:  [94 %-98 %] 94 % (05/17 0305) Weight:  [74.4 kg (164 lb)] 74.4 kg (164 lb) (05/16 0953)  Intake/Output from previous day: 05/16 0701 - 05/17 0700 In: 1000 [I.V.:1000] Out: 504 [Urine:235; Drains:44; Stool:150; Blood:75] Intake/Output this shift: No intake/output data recorded.  Labs: No results for input(s): HGB in the last 72 hours. No results for input(s): WBC, RBC, HCT, PLT in the last 72 hours. No results for input(s): NA, K, CL, CO2, BUN, CREATININE, GLUCOSE, CALCIUM in the last 72 hours. No results for input(s): LABPT, INR in the last 72 hours.  Physical Exam: Neurologically intact ABD soft Sensation intact distally Dorsiflexion/Plantar flexion intact Incision: no drainage Compartment soft Aspen collar in place Assessment/Plan: 1 Day Post-Op Procedure(s) (LRB): ACDF C4-7 (N/A) Advance diet Up with therapy May DC after cleared by PT Post op meds on chart  Mayo, Darla Lesches for Dr. Melina Schools [facility] [phone] 03/19/2017, 7:05 AM    Patient ID: Heidi Thompson, female   DOB: [birth year], 71 y.o.   MRN: [id number]

## 2017-03-19 NOTE — Evaluation (Signed)
Occupational Therapy Evaluation/Discharge Patient Details Name: Heidi Thompson: [id number] DOB: April 05, 1946 Today's Date: 03/19/2017    History of Present Illness Pt is a 71 y/o female who presents s/p C4-C7 ACDF on 03/18/17.   Clinical Impression   PTA, pt was independent with straight cane for ADL and functional mobility. She currently requires min guard-min assist with toilet transfers, min assist with shower transfers, and VC's to adhere to cervical precautions during UB bathing and dressing tasks. Educated pt on compensatory strategies for ADL to ensure safety and adherence to cervical precautions including 2 cup method for oral care at sink, dressing/bathing techniques, and safe shower transfers. Also instructed pt and husband concerning cervical collar wear schedule and method to wash and change padding. Pt and husband demonstrate understanding of all topics. No further acute OT needs identified. OT will sign off.    Follow Up Recommendations  No OT follow up;Supervision/Assistance - 24 hour    Equipment Recommendations  None recommended by OT (Has needs met)    Recommendations for Other Services       Precautions / Restrictions Precautions Precautions: Fall;Cervical Precaution Comments: Reviewed cervical handout in detail. Pt was cued for precautions during functional mobility. Required Braces or Orthoses: Cervical Brace Cervical Brace: Hard collar;At all times Restrictions Weight Bearing Restrictions: No      Mobility Bed Mobility               General bed mobility comments: OOB in chair on OT arrival.  Transfers Overall transfer level: Needs assistance Equipment used: None Transfers: Sit to/from Stand Sit to Stand: Min guard         General transfer comment: Increased stability with single UE support.    Balance Overall balance assessment: Needs assistance Sitting-balance support: Feet supported;No upper extremity supported Sitting balance-Leahy  Scale: Fair     Standing balance support: No upper extremity supported;During functional activity Standing balance-Leahy Scale: Fair Standing balance comment: Able to statically stand without UE support but requiring min assist at times during dynamic standing activity.                           ADL either performed or assessed with clinical judgement   ADL Overall ADL's : Needs assistance/impaired Eating/Feeding: Set up;Sitting   Grooming: Supervision/safety;Standing Grooming Details (indicate cue type and reason): VC's for 2 cup method to avoid bending over sink. Upper Body Bathing: Min guard;Sitting Upper Body Bathing Details (indicate cue type and reason): VC's to avoid raising hands over head. Lower Body Bathing: Sit to/from stand;Min guard   Upper Body Dressing : Min guard;Sitting Upper Body Dressing Details (indicate cue type and reason): VC's to avoid raising hands over head. Lower Body Dressing: Min guard;Sit to/from stand   Toilet Transfer: Min guard;Ambulation;BSC;Minimal assistance Toilet Transfer Details (indicate cue type and reason): Fluctuating min guard to min assist for stability during ambulation.  Toileting- Water quality scientist and Hygiene: Min guard;Sit to/from stand   Tub/ Shower Transfer: Walk-in shower;Cueing for sequencing;Shower seat;Ambulation;Minimal assistance   Functional mobility during ADLs: Min guard;Minimal assistance (fluctuating assist) General ADL Comments: Pt with improved stability with single UE support. Educated concerning compensatory strategies to ensure adherance to cervical precautions.     Vision Patient Visual Report: No change from baseline Vision Assessment?: No apparent visual deficits     Perception     Praxis      Pertinent Vitals/Pain Pain Assessment: Faces Faces Pain Scale: Hurts little more Pain Location: Incision site  Pain Descriptors / Indicators: Operative site guarding;Discomfort Pain  Intervention(s): Limited activity within patient's tolerance;Monitored during session;Repositioned     Hand Dominance     Extremity/Trunk Assessment Upper Extremity Assessment Upper Extremity Assessment: LUE deficits/detail;Overall [facility] for tasks assessed LUE Deficits / Details: Strength WFL but fatigues easily due to decreased use over time due to numbness. Reports tingling and numbness prior to surgery and that this has resolved.    Lower Extremity Assessment Lower Extremity Assessment: Defer to PT evaluation LLE Deficits / Details: Noted decreased heel strike and floor clearance on the L side. Pt reports she has not noticed any difficulties ambulating and did not demonstrate any unsteadiness/LOB/buckling related to LLE.    Cervical / Trunk Assessment Cervical / Trunk Assessment: Other exceptions Cervical / Trunk Exceptions: s/p cervical surgery   Communication Communication Communication: No difficulties   Cognition Arousal/Alertness: Awake/alert Behavior During Therapy: WFL for tasks assessed/performed Overall Cognitive Status: Within Functional Limits for tasks assessed                                     General Comments       Exercises     Shoulder Instructions      Home Living Family/patient expects to be discharged to:: Private residence Living Arrangements: Spouse/significant other Available Help at Discharge: Family;Available 24 hours/day Type of Home: House Home Access: Stairs to enter [employer] of Steps: 2 Entrance Stairs-Rails: None Home Layout: Two level;Laundry or work area in Building surveyor of Steps: flight   Bathroom Shower/Tub: Teacher, early years/pre: Standard Bathroom Accessibility: Yes   Home Equipment: Environmental consultant - 2 wheels;Cane - single point;Bedside commode          Prior Functioning/Environment Level of Independence: Independent with assistive device(s)        Comments: Using  cane for balance in weeks prior to surgery        OT Problem List: Decreased activity tolerance;Impaired balance (sitting and/or standing);Decreased safety awareness;Decreased knowledge of use of DME or AE;Pain      OT Treatment/Interventions:      OT Goals(Current goals can be found in the care plan section) Acute Rehab OT Goals Patient Stated Goal: Home today OT Goal Formulation: With patient/family Time For Goal Achievement: 04/02/17 Potential to Achieve Goals: Good  OT Frequency:     Barriers to D/C:            Co-evaluation              AM-PAC PT "6 Clicks" Daily Activity     Outcome Measure Help from another person eating meals?: None Help from another person taking care of personal grooming?: A Little Help from another person toileting, which includes using toliet, bedpan, or urinal?: A Little Help from another person bathing (including washing, rinsing, drying)?: A Little Help from another person to put on and taking off regular upper body clothing?: A Little Help from another person to put on and taking off regular lower body clothing?: A Little 6 Click Score: 19   End of Session Equipment Utilized During Treatment: Cervical collar Nurse Communication: Mobility status  Activity Tolerance: Patient tolerated treatment well Patient left: in chair;with call bell/phone within reach;with family/visitor present  OT Visit Diagnosis: Unsteadiness on feet (R26.81);Other abnormalities of gait and mobility (R26.89)                Time: 8264-1583 OT Time  Calculation (min): 12 min Charges:  OT General Charges $OT Visit: 1 Procedure OT Evaluation $OT Eval Low Complexity: 1 Procedure G-Codes:     Norman Herrlich, MS OTR/L  Pager: [place] A Callie Facey 03/19/2017, 10:15 AM

## 2017-03-19 NOTE — Anesthesia Postprocedure Evaluation (Signed)
Anesthesia Post Note  Patient: Blandina Renaldo  Procedure(s) Performed: Procedure(s) (LRB): ACDF C4-7 (N/A)  Patient location during evaluation: PACU Anesthesia Type: General Level of consciousness: awake and alert Pain management: pain level controlled Vital Signs Assessment: post-procedure vital signs reviewed and stable Respiratory status: spontaneous breathing, nonlabored ventilation, respiratory function stable and patient connected to nasal cannula oxygen Cardiovascular status: blood pressure returned to baseline and stable Postop Assessment: no signs of nausea or vomiting Anesthetic complications: no       Last Vitals:  Vitals:   03/19/17 0305 03/19/17 0727  BP: 138/69 135/60  Pulse: 73 71  Resp: 17 18  Temp: 36.9 C 36.9 C    Last Pain:  Vitals:   03/19/17 0727  TempSrc: Oral  PainSc:                  Tiajuana Amass

## 2017-03-19 NOTE — Evaluation (Signed)
Physical Therapy Evaluation Patient Details Name: Heidi Thompson MRN: [id number] DOB: October 13, 1946 Today's Date: 03/19/2017   History of Present Illness  Pt is a 71 y/o female who presents s/p C4-C7 ACDF on 03/18/17.  Clinical Impression  Pt admitted with above diagnosis. Pt currently with functional limitations due to the deficits listed below (see PT Problem List). At the time of PT eval pt was able to perform transfers and ambulation with min guard to min assist for balance support and safety. Pt and husband were educated on precautions, brace wearing schedule, walking program, stair negotiation, car transfer and general safety. Pt will benefit from skilled PT to increase their independence and safety with mobility to allow discharge to the venue listed below.       Follow Up Recommendations No PT follow up;Supervision for mobility/OOB    Equipment Recommendations  None recommended by PT    Recommendations for Other Services       Precautions / Restrictions Precautions Precautions: Fall;Cervical Precaution Comments: Reviewed cervical handout in detail. Pt was cued for precautions during functional mobility. Required Braces or Orthoses: Cervical Brace Cervical Brace: Hard collar;At all times Restrictions Weight Bearing Restrictions: No      Mobility  Bed Mobility               General bed mobility comments: Pt was received exiting the bathroom with husband present  Transfers Overall transfer level: Needs assistance Equipment used: None;Straight cane Transfers: Sit to/from Stand Sit to Stand: Min guard         General transfer comment: Hands-on guarding provided for safety and balance support without the SPC present. With cane, light guard only required.  Ambulation/Gait Ambulation/Gait assistance: Min assist Ambulation Distance (Feet): 200 Feet Assistive device: Straight cane Gait Pattern/deviations: Step-through pattern;Decreased stride length;Trunk flexed Gait  velocity: Decreased Gait velocity interpretation: Below normal speed for age/gender General Gait Details: VC's for improved posture. Assist provided x3 for unsteadiness/minor LOB throughout gait training.   Stairs Stairs: Yes Stairs assistance: Min guard Stair Management: One rail Left;With cane;Step to pattern;Forwards Number of Stairs: 2 General stair comments: VC's for sequencing and safety.   Wheelchair Mobility    Modified Rankin (Stroke Patients Only)       Balance Overall balance assessment: Needs assistance Sitting-balance support: Feet supported;No upper extremity supported Sitting balance-Leahy Scale: Fair     Standing balance support: No upper extremity supported;During functional activity Standing balance-Leahy Scale: Poor Standing balance comment: Occasional minimal assist provided for balance during dynamic standing activity                             Pertinent Vitals/Pain Pain Assessment: Faces Faces Pain Scale: Hurts little more Pain Location: Incision site Pain Descriptors / Indicators: Operative site guarding;Discomfort Pain Intervention(s): Limited activity within patient's tolerance;Monitored during session;Repositioned    Home Living Family/patient expects to be discharged to:: Private residence Living Arrangements: Spouse/significant other Available Help at Discharge: Family;Available 24 hours/day Type of Home: House Home Access: Stairs to enter Entrance Stairs-Rails: None Entrance Stairs-Number of Steps: 2 Home Layout: Two level;Laundry or work area in [place]: Environmental consultant - 2 wheels;Cane - single point;Bedside commode      Prior Function Level of Independence: Independent with assistive device(s)         Comments: Using cane for balance in weeks prior to surgery     Hand Dominance        Extremity/Trunk Assessment   Upper Extremity  Assessment Upper Extremity Assessment: Defer to OT evaluation    Lower  Extremity Assessment Lower Extremity Assessment: Overall WFL for tasks assessed;LLE deficits/detail LLE Deficits / Details: Noted decreased heel strike and floor clearance on the L side. Pt reports she has not noticed any difficulties ambulating and did not demonstrate any unsteadiness/LOB/buckling related to LLE.     Cervical / Trunk Assessment Cervical / Trunk Assessment: Other exceptions Cervical / Trunk Exceptions: s/p cervical surgery  Communication   Communication: No difficulties  Cognition Arousal/Alertness: Awake/alert Behavior During Therapy: WFL for tasks assessed/performed Overall Cognitive Status: Within Functional Limits for tasks assessed                                        General Comments      Exercises     Assessment/Plan    PT Assessment Patient needs continued PT services  PT Problem List Decreased strength;Decreased range of motion;Decreased activity tolerance;Decreased balance;Decreased mobility;Decreased knowledge of use of DME;Decreased safety awareness;Decreased knowledge of precautions;Pain       PT Treatment Interventions DME instruction;Gait training;Stair training;Functional mobility training;Therapeutic activities;Therapeutic exercise;Neuromuscular re-education;Patient/family education    PT Goals (Current goals can be found in the Care Plan section)  Acute Rehab PT Goals Patient Stated Goal: Home today PT Goal Formulation: With patient/family Time For Goal Achievement: 03/26/17 Potential to Achieve Goals: Good    Frequency Min 5X/week   Barriers to discharge        Co-evaluation               AM-PAC PT "6 Clicks" Daily Activity  Outcome Measure Difficulty turning over in bed (including adjusting bedclothes, sheets and blankets)?: A Little Difficulty moving from lying on back to sitting on the side of the bed? : A Little Difficulty sitting down on and standing up from a chair with arms (e.g., wheelchair, bedside  commode, etc,.)?: Total Help needed moving to and from a bed to chair (including a wheelchair)?: A Little Help needed walking in hospital room?: A Little Help needed climbing 3-5 steps with a railing? : A Little 6 Click Score: 16    End of Session Equipment Utilized During Treatment: Gait belt;Cervical collar Activity Tolerance: Patient tolerated treatment well Patient left: in chair;with call bell/phone within reach;with family/visitor present Nurse Communication: Mobility status PT Visit Diagnosis: Unsteadiness on feet (R26.81);Pain Pain - part of body:  (Neck)    Time: 7262-0355 PT Time Calculation (min) (ACUTE ONLY): 21 min   Charges:   PT Evaluation $PT Eval Moderate Complexity: 1 Procedure     PT G Codes:        Heidi Thompson, PT, DPT Acute Rehabilitation Services Pager: [phone]   Heidi Thompson 03/19/2017, 9:31 AM

## 2017-03-19 NOTE — Discharge Summary (Signed)
Physician Discharge Summary  Patient ID: Heidi Thompson MRN: [id number] DOB/AGE: 73-Aug-1947 84 y.o.  Admit date: 03/18/2017 Discharge date: 03/19/2017  Admission Diagnoses:  Cervical DDD  Discharge Diagnoses:  Active Problems:   Neck pain   Past Medical History:  Diagnosis Date  . Anxiety   . Arthritis    spondylotic radiculopathy, neck, back, shoulder   . Cancer ([employer]) 1978   melanoma- on R leg, removed  . Diabetes mellitus without complication ([place])   . Heart murmur   . History of kidney stones    on imaging, saw 2 stones in kidney & pt. reports that she thinks she has passed one on her own at one time.   . Hypertension   . PONV (postoperative nausea and vomiting)     Surgeries: Procedure(s): ACDF C4-7 on 03/18/2017   Consultants (if any):   Discharged Condition: Improved  Hospital Course: [name] is an 71 y.o. female who was admitted 03/18/2017 with a diagnosis of Cervical DDD and went to the operating room on 03/18/2017 and underwent the above named procedures.  Day 1 post op pts pain is well controlled on oral medications.  Denies arm pain.  Pt is urinating w/o difficulty.  Pt is ambulating in hallway.  Pt is cleared by PT to go home.  She was given perioperative antibiotics:  Anti-infectives    Start     Dose/Rate Route Frequency Ordered Stop   03/18/17 2000  ceFAZolin (ANCEF) IVPB 2g/100 mL premix     2 g 200 mL/hr over 30 Minutes Intravenous Every 8 hours 03/18/17 1720 03/19/17 0329   03/18/17 0940  ceFAZolin (ANCEF) IVPB 2g/100 mL premix     2 g 200 mL/hr over 30 Minutes Intravenous 30 min pre-op 03/18/17 0940 03/18/17 1215    .  She was given sequential compression devices, early ambulation, and TED for DVT prophylaxis.  She benefited maximally from the hospital stay and there were no complications.    Recent vital signs:  Vitals:   03/19/17 0305 03/19/17 0727  BP: 138/69 135/60  Pulse: 73 71  Resp: 17 18  Temp: 98.4 F (36.9 C) 98.4 F (36.9 C)     Recent laboratory studies:  Lab Results  Component Value Date   HGB 12.1 03/13/2017   Lab Results  Component Value Date   WBC 7.1 03/13/2017   PLT 319 03/13/2017   No results found for: INR Lab Results  Component Value Date   NA 139 03/13/2017   K 3.2 (L) 03/13/2017   CL 100 (L) 03/13/2017   CO2 29 03/13/2017   BUN 12 03/13/2017   CREATININE 0.97 03/13/2017   GLUCOSE 116 (H) 03/13/2017    Discharge Medications:   Allergies as of 03/19/2017   No Known Allergies     Medication List    STOP taking these medications   naproxen 500 MG tablet Commonly known as:  NAPROSYN   traMADol 50 MG tablet Commonly known as:  ULTRAM     TAKE these medications   atorvastatin 20 MG tablet Commonly known as:  LIPITOR Take 20 mg by mouth every evening.   carvedilol 25 MG tablet Commonly known as:  COREG Take 25 mg by mouth 2 (two) times daily.   chlorthalidone 25 MG tablet Commonly known as:  HYGROTON Take 25 mg by mouth daily before breakfast.   escitalopram 20 MG tablet Commonly known as:  LEXAPRO Take 20 mg by mouth daily before breakfast.   gabapentin 300 MG capsule Commonly known  as:  NEURONTIN Take 1 capsule (300 mg total) by mouth 2 (two) times daily. What changed:  when to take this   levothyroxine 50 MCG tablet Commonly known as:  SYNTHROID, LEVOTHROID Take 50 mcg by mouth daily before breakfast. 2 hours after breakfast   LORazepam 1 MG tablet Commonly known as:  ATIVAN Take 1 mg by mouth 2 (two) times daily.   metFORMIN 500 MG tablet Commonly known as:  GLUCOPHAGE Take 500 mg by mouth every evening.   methocarbamol 500 MG tablet Commonly known as:  ROBAXIN Take 1 tablet (500 mg total) by mouth 3 (three) times daily as needed for muscle spasms.   omeprazole 20 MG tablet Commonly known as:  PRILOSEC OTC Take 20 mg by mouth daily as needed (acid reflux).   ondansetron 4 MG tablet Commonly known as:  ZOFRAN Take 1 tablet (4 mg total) by mouth  every 8 (eight) hours as needed for nausea or vomiting.   oxyCODONE-acetaminophen 10-325 MG tablet Commonly known as:  PERCOCET Take 1 tablet by mouth every 4 (four) hours as needed for pain.   potassium chloride SA 20 MEQ tablet Commonly known as:  K-DUR,KLOR-CON Take 20 mEq by mouth daily with lunch.   SYSTANE OP Apply 1 drop to eye at bedtime.       Diagnostic Studies: Dg Cervical Spine 2 Or 3 Views  Result Date: 03/18/2017 CLINICAL DATA:  Postop from anterior cervical disc fusion. EXAM: CERVICAL SPINE - 2-3 VIEW COMPARISON:  Operative images obtained today. FINDINGS: AP and lateral views show an anterior fusion plate spanning C4 through C7. There are fixation screws in the C4, C5, C6 and C7 vertebra. The orthopedic hardware appears well-seated. Metallic intervertebral cages are well centered maintaining disc height at the C4-C5, C5-C6 and C6-C7 levels. There is no evidence of an operative complication. IMPRESSION: Well-positioned orthopedic hardware following anterior cervical disc fusion from C4 through C7. Electronically Signed   By: Lajean Manes M.D.   On: 03/18/2017 16:48   Dg Cervical Spine 2-3 Views  Result Date: 03/18/2017 CLINICAL DATA:  Imaging from anterior cervical disc fusion. 39 seconds of fluoroscopy. EXAM: CERVICAL SPINE - 2-3 VIEW COMPARISON:  None. FINDINGS: An anterior fusion plate spans C4 through C7. There are metallic disc spacers maintaining disc height at C4-C5, C5-C6 and C6-C7. Spacers are well positioned. Orthopedic hardware appears well-seated. No evidence of an operative complication. IMPRESSION: Portable operative imaging for anterior cervical disc fusion, C4 through C7. Electronically Signed   By: Lajean Manes M.D.   On: 03/18/2017 15:34   Dg C-arm Gt 120 Min  Result Date: 03/18/2017 CLINICAL DATA:  Cervical spine fusion. EXAM: DG C-ARM GT 120 MIN CONTRAST:  None. FLUOROSCOPY TIME:  Fluoroscopy Time:  38.7 seconds Radiation Exposure Index (if provided by  the fluoroscopic device): 3.9 mGy Number of Acquired Spot Images: 3 images COMPARISON:  No prior. FINDINGS: C4 through C7 anterior and interbody fusion. Hardware intact. Anatomic alignment. No acute abnormality . IMPRESSION: C4 through C7 anterior interbody fusion.  Anatomic alignment . Electronically Signed   By: Marcello Moores  Register   On: 03/18/2017 15:26    Disposition: Final discharge disposition not confirmed Post op meds provided Pt will follow up in office in 2 weeks  Discharge Instructions    Incentive spirometry RT    Complete by:  As directed       Follow-up Information    Melina Schools, MD. Schedule an appointment as soon as possible for a visit in 2 weeks.  Specialty:  Orthopedic Surgery Why:  If symptoms worsen, For suture removal, For wound re-check Contact information: [address] [place] [address] [phone]            Signed: Valinda Hoar 03/19/2017, 11:36 AM

## 2017-03-19 NOTE — Op Note (Signed)
Heidi Thompson, Heidi Thompson                ACCOUNT NO.:  [id number]  MEDICAL RECORD NO.:  [id number]  LOCATION:  [facility]                         FACILITY:  [facility]  PHYSICIAN:  Dylann Layne D. Rolena Infante, M.D. DATE OF BIRTH:  [birth year]  DATE OF PROCEDURE:  03/18/2017 DATE OF DISCHARGE:                              OPERATIVE REPORT   PREOPERATIVE DIAGNOSIS:  Multilevel cervical spondylitic radiculopathy, C4-5, C5-6, and C6-7.  POSTOPERATIVE DIAGNOSIS:  Multilevel cervical spondylitic radiculopathy, C4-5, C5-6, and C6-7.  OPERATIVE PROCEDURE:  Anterior cervical diskectomy and fusion, C4-7.  FIRST ASSISTANT:  [place], [place].  COMPLICATIONS:  None.  IMPLANT SYSTEM USED:  Titan nanoLOCK intervertebral cages at C6-7 and C5- 6 were size 6 small and at C4-5, it was a 7 small with a DePuy translational 48 mm length plate affixed with 14 mm screws.  HISTORY:  This is a very pleasant, elderly woman, who has been complaining of significant neck and radicular right arm pain in the C5, C6, C7 dermatome.  In addition, there was weakness.  As a result of the failure to improve with conservative management, we elected to proceed with surgery.  All appropriate risks, benefits, and alternatives were discussed and consent was obtained.  OPERATIVE NOTE:  The patient was brought to the operating room and placed supine on the operating table.  After successful induction of general anesthesia and endotracheal intubation, TEDs, SCDs and a Foley were inserted.  A bump was placed underneath the shoulder blades.  The arms were secured at the side and the anterior cervical spine was prepped and draped in a standard fashion.  Time-out was taken confirming patient, procedure, and all other pertinent important data.  Once this was done, a longitudinal incision was made starting over the medial border of the sternocleidomastoid.  Sharp dissection was carried out down to the platysma.  The platysma was sharply incised and I  continued dissecting through the deep cervical fascia along the medial border of the sternocleidomastoid.  The omohyoid muscle was identified and sacrificed for visualization.  At this point, a thyroid retractor was placed to retract the esophagus and the trachea and then I used Kittner dissectors to mobilize the remaining prevertebral fascia and expose the anterior longitudinal ligament.  Once I had the anterior cervical spine exposed from C4-C7, I placed a needle into the C4-5 disk space and confirmed that I was at the appropriate level.  Once this was done, I mobilized the longus colli muscle from the midbody of C4 to the midbody of C7 bilaterally using Bovie.  I removed the large anterior exostoses with a double-action Leksell rongeur and then placed my Caspar retracting blades underneath the longus colli muscle.  I deflated the endotracheal cuff, expanded the retractor, then reinflated the cuff.  I then exposed the C6-7 disk space.  An annulotomy was performed with a 15- blade scalpel and then I removed the disk material with the pituitary rongeur.  I then resected the overhanging osteophyte from the inferior aspect of the C6 vertebral body and then placed distraction pins into the body of C6 and C7.  I distracted the intervertebral space and maintained it with the distraction pins.  I continued removing  disk material down to the posterior annulus.  Using a 1 mm Kerrison, I resected the posterior osteophytes from the vertebral bodies and I undercut the uncovertebral joints.  I removed the bone spurs and released the posterior longitudinal ligament using a nerve hook and my 1 mm Kerrison rongeur.  At this point, I had uncovertebral decompression and decompression of the nerve root and central decompression.  I then rasped the endplates and ensured I had bleeding subchondral bone and then trialed and placed a size 6 small nanoLOCK intervertebral cage. This was packed with the ViviGen  graft.  I just repositioned the retractor at C5-6 and then used the same technique to perform a diskectomy at this level.  Again, care was taken to maximize my decompression on the left side as this was her symptomatic side.  Once the disk and osteophyte were removed and the PLL was taken down, I undercut the uncovertebral joints, measured and placed the same size graft.  I again repositioned the retracting blades and using the same technique, performed a diskectomy at C4-5.  Once again, care was taken especially on the left-hand side to ensure an adequate decompression.  This side, I was able to place a size 7.  With all 3 cages properly positioned, I irrigated copiously with normal saline and made sure I had hemostasis. I then applied the anterior cervical plate and I fixed it with self- drilling screws into the bodies of C4, C5, C6, and C7.  All screws had excellent purchase.  I then removed the clips to allow it to compress.  At this point, I irrigated the wound copiously with normal saline.  Final x-rays were taken, which were satisfactory.  At this point, the fusion was complete. I did place a deep drain and returned the trachea and esophagus to midline after removing all the retractors.  I then closed in a layered fashion with interrupted 2-0 Vicryl suture and a 3-0 Monocryl.  Steri- Strips, dry dressing, and an Aspen collar were applied.  The patient was ultimately extubated, transferred to the PACU without incident.  At the end of the case, all needle and sponge counts were correct.  There were no adverse intraoperative events.     Jenney Brester D. Rolena Infante, M.D.     DDB/MEDQ  D:  03/18/2017  T:  03/19/2017  Job:  [id number]

## 2017-03-20 ENCOUNTER — Encounter (HOSPITAL_COMMUNITY): Payer: Self-pay | Admitting: Orthopedic Surgery

## 2017-03-20 LAB — HEMOGLOBIN A1C
Hgb A1c MFr Bld: 5.7 % — ABNORMAL HIGH (ref 4.8–5.6)
Mean Plasma Glucose: 117 mg/dL

## 2018-05-31 ENCOUNTER — Other Ambulatory Visit (HOSPITAL_COMMUNITY): Payer: Self-pay | Admitting: Orthopedic Surgery

## 2018-05-31 DIAGNOSIS — Z780 Asymptomatic menopausal state: Secondary | ICD-10-CM

## 2018-06-01 ENCOUNTER — Ambulatory Visit (HOSPITAL_COMMUNITY)
Admission: RE | Admit: 2018-06-01 | Discharge: 2018-06-01 | Disposition: A | Discharge status: Home / Self Care | Payer: Medicare Other | Source: Ambulatory Visit | Attending: Orthopedic Surgery | Admitting: Orthopedic Surgery

## 2018-06-01 DIAGNOSIS — Z780 Asymptomatic menopausal state: Secondary | ICD-10-CM | POA: Insufficient documentation

## 2018-06-10 ENCOUNTER — Other Ambulatory Visit (HOSPITAL_COMMUNITY): Payer: Medicare Other

## 2018-07-09 ENCOUNTER — Encounter (HOSPITAL_COMMUNITY): Payer: Self-pay

## 2018-07-09 ENCOUNTER — Other Ambulatory Visit: Payer: Self-pay

## 2018-07-09 ENCOUNTER — Encounter (HOSPITAL_COMMUNITY)
Admission: RE | Admit: 2018-07-09 | Discharge: 2018-07-09 | Disposition: A | Discharge status: Home / Self Care | Payer: Medicare Other | Source: Ambulatory Visit | Attending: Orthopedic Surgery | Admitting: Orthopedic Surgery

## 2018-07-09 DIAGNOSIS — Z01810 Encounter for preprocedural cardiovascular examination: Secondary | ICD-10-CM | POA: Insufficient documentation

## 2018-07-09 DIAGNOSIS — I10 Essential (primary) hypertension: Secondary | ICD-10-CM | POA: Insufficient documentation

## 2018-07-09 LAB — BASIC METABOLIC PANEL
Anion gap: 9 (ref 5–15)
BUN: 7 mg/dL — AB (ref 8–23)
CALCIUM: 9.7 mg/dL (ref 8.9–10.3)
CO2: 29 mmol/L (ref 22–32)
CREATININE: 0.99 mg/dL (ref 0.44–1.00)
Chloride: 103 mmol/L (ref 98–111)
GFR calc non Af Amer: 55 mL/min — ABNORMAL LOW (ref >60)
Glucose, Bld: 108 mg/dL — ABNORMAL HIGH (ref 70–99)
Potassium: 3.4 mmol/L — ABNORMAL LOW (ref 3.5–5.1)
Sodium: 141 mmol/L (ref 135–145)

## 2018-07-09 LAB — CBC
HCT: 39 % (ref 36.0–46.0)
Hemoglobin: 13.1 g/dL (ref 12.0–15.0)
MCH: 31.1 pg (ref 26.0–34.0)
MCHC: 33.6 g/dL (ref 30.0–36.0)
MCV: 92.6 fL (ref 78.0–100.0)
PLATELETS: 279 10*3/uL (ref 150–400)
RBC: 4.21 MIL/uL (ref 3.87–5.11)
RDW: 12.6 % (ref 11.5–15.5)
WBC: 7.3 10*3/uL (ref 4.0–10.5)

## 2018-07-09 LAB — ABO/RH: ABO/RH(D): O POS

## 2018-07-09 LAB — SURGICAL PCR SCREEN
MRSA, PCR: NEGATIVE
Staphylococcus aureus: NEGATIVE

## 2018-07-09 LAB — HEMOGLOBIN A1C
HEMOGLOBIN A1C: 5.8 % — AB (ref 4.8–5.6)
Mean Plasma Glucose: 119.76 mg/dL

## 2018-07-09 LAB — TYPE AND SCREEN
ABO/RH(D): O POS
Antibody Screen: NEGATIVE

## 2018-07-09 NOTE — Pre-Procedure Instructions (Signed)
Heidi Thompson  07/09/2018      [place], [place] [address] Phone: [phone] Fax: [phone]    Your procedure is scheduled on Wednesday, September 11th.  Report to [facility] Admitting at 6:30 A.M.  Call this number if you have problems the morning of surgery:  [phone]   Remember:  Do not eat or drink after midnight.    Take these medicines the morning of surgery with A SIP OF WATER  carvedilol (COREG) 25 MG tablet escitalopram (LEXAPRO) 20 MG tablet chlorthalidone (HYGROTON) 25 MG tablet levothyroxine (SYNTHROID, LEVOTHROID) 50 MCG tablet LORazepam (ATIVAN) 1 MG tablet omeprazole (PRILOSEC OTC) 20 MG tablet Polyethyl Glycol-Propyl Glycol (SYSTANE OP)  7 days prior to surgery STOP taking any Aspirin(unless otherwise instructed by your surgeon), Aleve, Naproxen, Ibuprofen, Motrin, Advil, Goody's, BC's, all herbal medications, fish oil, and all vitamins   WHAT DO I DO ABOUT MY DIABETES MEDICATION?   Marland Kitchen Do not take oral diabetes medicines (pills) the morning of surgery. METFORMIN (GLUCOPHAGE)   . The day of surgery, do not take other diabetes injectables, including Byetta (exenatide), Bydureon (exenatide ER), Victoza (liraglutide), or Trulicity (dulaglutide).  . If your CBG is greater than 220 mg/dL, you may take  of your sliding scale (correction) dose of insulin.   How to Manage Your Diabetes Before and After Surgery  Why is it important to control my blood sugar before and after surgery? . Improving blood sugar levels before and after surgery helps healing and can limit problems. . A way of improving blood sugar control is eating a healthy diet by: o  Eating less sugar and carbohydrates o  Increasing activity/exercise o  Talking with your doctor about reaching your blood sugar goals . High blood sugars (greater than 180 mg/dL) can raise your risk of infections and slow your  recovery, so you will need to focus on controlling your diabetes during the weeks before surgery. . Make sure that the doctor who takes care of your diabetes knows about your planned surgery including the date and location.  How do I manage my blood sugar before surgery? . Check your blood sugar at least 4 times a day, starting 2 days before surgery, to make sure that the level is not too high or low. o Check your blood sugar the morning of your surgery when you wake up and every 2 hours until you get to the Short Stay unit. . If your blood sugar is less than 70 mg/dL, you will need to treat for low blood sugar: o Do not take insulin. o Treat a low blood sugar (less than 70 mg/dL) with  cup of clear juice (cranberry or apple), 4 glucose tablets, OR glucose gel. o Recheck blood sugar in 15 minutes after treatment (to make sure it is greater than 70 mg/dL). If your blood sugar is not greater than 70 mg/dL on recheck, call [phone] for further instructions. . Report your blood sugar to the short stay nurse when you get to Short Stay.  . If you are admitted to the hospital after surgery: o Your blood sugar will be checked by the staff and you will probably be given insulin after surgery (instead of oral diabetes medicines) to make sure you have good blood sugar levels. o The goal for blood sugar control after surgery is 80-180 mg/dL.    Do not wear jewelry, make-up or nail polish.  Do  not wear lotions, powders, or perfumes, or deodorant.  Do not shave 48 hours prior to surgery.    Do not bring valuables to the hospital.  [facility] is not responsible for any belongings or valuables.  Contacts, dentures or bridgework may not be worn into surgery.  Leave your suitcase in the car.  After surgery it may be brought to your room.  For patients admitted to the hospital, discharge time will be determined by your treatment team.  Patients discharged the day of surgery will not be allowed to drive  home.   Special instructions:   [facility]- Preparing For Surgery  Before surgery, you can play an important role. Because skin is not sterile, your skin needs to be as free of germs as possible. You can reduce the number of germs on your skin by washing with CHG (chlorahexidine gluconate) Soap before surgery.  CHG is an antiseptic cleaner which kills germs and bonds with the skin to continue killing germs even after washing.    Oral Hygiene is also important to reduce your risk of infection.  Remember - BRUSH YOUR TEETH THE MORNING OF SURGERY WITH YOUR REGULAR TOOTHPASTE  Please do not use if you have an allergy to CHG or antibacterial soaps. If your skin becomes reddened/irritated stop using the CHG.  Do not shave (including legs and underarms) for at least 48 hours prior to first CHG shower. It is OK to shave your face.  Please follow these instructions carefully.   1. Shower the NIGHT BEFORE SURGERY and the MORNING OF SURGERY with CHG.   2. If you chose to wash your hair, wash your hair first as usual with your normal shampoo.  3. After you shampoo, rinse your hair and body thoroughly to remove the shampoo.  4. Use CHG as you would any other liquid soap. You can apply CHG directly to the skin and wash gently with a scrungie or a clean washcloth.   5. Apply the CHG Soap to your body ONLY FROM THE NECK DOWN.  Do not use on open wounds or open sores. Avoid contact with your eyes, ears, mouth and genitals (private parts). Wash Face and genitals (private parts)  with your normal soap.  6. Wash thoroughly, paying special attention to the area where your surgery will be performed.  7. Thoroughly rinse your body with warm water from the neck down.  8. DO NOT shower/wash with your normal soap after using and rinsing off the CHG Soap.  9. Pat yourself dry with a CLEAN TOWEL.  10. Wear CLEAN PAJAMAS to bed the night before surgery, wear comfortable clothes the morning of  surgery  11. Place CLEAN SHEETS on your bed the night of your first shower and DO NOT SLEEP WITH PETS.    Day of Surgery:  Do not apply any deodorants/lotions.  Please wear clean clothes to the hospital/surgery center.   Remember to brush your teeth WITH YOUR REGULAR TOOTHPASTE.  Please read over the following fact sheets that you were given.

## 2018-07-09 NOTE — H&P (Addendum)
Patient ID: Heidi Thompson MRN: [id number] DOB/AGE: Jan 27, 1946 72 y.o.  Admit date: (Not on file)  Admission Diagnoses:  Lumbar degenerative disc disease  HPI: The patient is here today for a pre-operative History and Physical. They are scheduled for XLIF L2-4, PSFI L2-5 on -11-19 with Dr. Rolena Infante at  [facility].  Overall patient reports a history of good health.  She does have prediabetes and is treated on Metformin patient does take atorvastatin for lipidemia she is also on blood pressure medication.  Overall patient reports well controlled  Past Medical History: Past Medical History:  Diagnosis Date  . Anxiety   . Arthritis    spondylotic radiculopathy, neck, back, shoulder   . Cancer ([place]) 1978   melanoma- on R leg, removed  . Diabetes mellitus without complication ([place])   . Heart murmur   . History of kidney stones    on imaging, saw 2 stones in kidney & pt. reports that she thinks she has passed one on her own at one time.   . Hypertension   . PONV (postoperative nausea and vomiting)     Surgical History: Past Surgical History:  Procedure Laterality Date  . ABDOMINAL HYSTERECTOMY    . ANTERIOR CERVICAL DECOMP/DISCECTOMY FUSION N/A 03/18/2017   Procedure: ACDF C4-7;  Surgeon: Melina Schools, MD;  Location: [facility];  Service: Orthopedics;  Laterality: N/A;  Requests 4 hours  . CHOLECYSTECTOMY    . EYE SURGERY Bilateral    cataracts /w IOL  . JOINT REPLACEMENT Bilateral    knees  . SHOULDER ARTHROSCOPY Right 2016  . thumb joi    . thumb Joint  Right    thumb joint On R hand replaced  . TOTAL THYROIDECTOMY      Family History: No family history on file.  Social History: Social History   Socioeconomic History  . Marital status: Married    Spouse name: Not on file  . Number of children: Not on file  . Years of education: Not on file  . Highest education level: Not on file  Occupational History  . Not on file  Social Needs  . Financial resource  strain: Not on file  . Food insecurity:    Worry: Not on file    Inability: Not on file  . Transportation needs:    Medical: Not on file    Non-medical: Not on file  Tobacco Use  . Smoking status: Never Smoker  . Smokeless tobacco: Never Used  Substance and Sexual Activity  . Alcohol use: No  . Drug use: No  . Sexual activity: Not on file  Lifestyle  . Physical activity:    Days per week: Not on file    Minutes per session: Not on file  . Stress: Not on file  Relationships  . Social connections:    Talks on phone: Not on file    Gets together: Not on file    Attends religious service: Not on file    Active member of club or organization: Not on file    Attends meetings of clubs or organizations: Not on file    Relationship status: Not on file  . Intimate partner violence:    Fear of current or ex partner: Not on file    Emotionally abused: Not on file    Physically abused: Not on file    Forced sexual activity: Not on file  Other Topics Concern  . Not on file  Social History Narrative  . Not  on file    Allergies: Patient has no known allergies.  Medications: I have reviewed the patient's current medications.  Vital Signs: No data found.  Radiology: No results found.  Labs: No results for input(s): WBC, RBC, HCT, PLT in the last 72 hours. No results for input(s): NA, K, CL, CO2, BUN, CREATININE, GLUCOSE, CALCIUM in the last 72 hours. No results for input(s): LABPT, INR in the last 72 hours.  Review of Systems: ROS  Physical Exam: There is no height or weight on file to calculate BMI.  Physical Exam  Constitutional: She is oriented to person, place, and time. She appears well-developed and well-nourished.  Cardiovascular: Normal rate and regular rhythm.  Respiratory: Effort normal and breath sounds normal.  GI: Soft. Bowel sounds are normal.  Neurological: She is alert and oriented to person, place, and time.  Skin: Skin is warm and dry.  Psychiatric:  She has a normal mood and affect. Her behavior is normal. Judgment and thought content normal.   Significant loss in range of motion especially extension and rotation.  No significant hip, knee, ankle pain with isolated joint range of motion. Neuro: Positive dysesthesias in the lower extremity bilaterally.  5 out of 5 strength in the lower extremity.  Negative nerve retention signs in the lower extremity.  1+ symmetrical deep tendon reflexes, negative Babinski test  Vascular: Lower extremity peripheral pulses are 1+ and symmetrical.  Compartments are soft and nontender  Imaging studies: Bone density scan demonstrates no osteoporosis or osteopenia.  Patient has normal bone density  Imaging studies: She has a flat back deformity and advanced degenerative disc disease L3-4 and L4-5 moderate to significant degenerative disc disease at L2-3.  X-rays show that there is calcifications within the descending vasculature anterior to the vertebral bodies.  MRI of the lumbar spine  There is retrolisthesis at L3-4 and L4-5.  Multilevel degenerative disc sees most noted at L2-3 , 3 4, and L4-5.  No severe stenosis is noted.  No acute fracture is seen.   Assessment and Plan: Risks and benefits of surgery were discussed with the patient. These include: Infection, bleeding, death, stroke, paralysis, ongoing or worse pain, need for additional surgery, nonunion, leak of spinal fluid, adjacent segment degeneration requiring additional fusion surgery, Injury to abdominal vessels that can require anterior surgery to stop bleeding. Malposition of the cage and/or pedicle screws that could require additional surgery. Loss of bowel and bladder control. Postoperative hematoma causing neurologic compression that could require urgent or emergent re-operation.  Goals of surgery: Reduced (not eliminated) pain, and improved quality of life.  Ronette Deter, PAC for Melina Schools, MD [facility] [phone]  Assessment  & Plan Diagnosis: Akiva returns today for follow-up of her bone density scan. Fortunately she has no significant evidence of metabolic bone disease.  Despite injection therapy, physical therapy, activity modification or quality of life still remains quite poor and the pain is still debilitating. At this point she would like to move forward with surgery. Given the fact that she has no osteoporosis is reasonable to move forward with a multilevel fusion procedure. Structural she has a flat back deformity with advanced degenerative disc disease L2-3 L3-4 and L4-5. The L5-S1 disc is still maintained although there are degenerative changes. At this point I think the best course of action is an L2-L5 instrumented fusion. There is very little disc material at the L4-5 level so I do not think interbody fixation is going to be required at that level. My  recommendation is a lateral 2334 fusion to aid in restoring sagittal balance and then doing a supplemental posterior fixation. I think addressing the degenerative disc disease as well as the sagittal imbalance may help improve her overall pain and therefore quality of life.  I have gone over the procedure with the patient and her husband as well as the risks and benefits and alternatives to surgery. All their questions and concerns were addressed.  Risks of a lateral interbody fusion:infection, bleeding, death, stroke, paralysis, ongoing or worse pain, need for additional surgery, injury to the lumbar plexus resulting in hip flexor weakness and difficulty walking without assistive devices. Adjacent segment degenerative disease, need for additional surgery including fusing other levels, leak of spinal fluid, Nonunion, hardware failure, breakage, or mal-position. Deep venous thrombosis (DVT) requiring additional treatment such as filter, and/or medications. Injury to abdominal contents, loss in bowel and bladder control.  Risks posterior spinal fusion. These include:  Infection, bleeding, death, stroke, paralysis, ongoing or worse pain, need for additional surgery, nonunion, leak of spinal fluid, adjacent segment degeneration requiring additional fusion surgery, Injury to abdominal vessels that can require anterior surgery to stop bleeding. Malposition of the cage and/or pedicle screws that could require additional surgery. Loss of bowel and bladder control. Postoperative hematoma causing neurologic compression that could require urgent or emergent re-operation.  No change in clinical exam since last office visit of 07/09/18 Plan on moving forward with ant/post surgery Case reviewed along with risks/benefits - all questions addressed.

## 2018-07-09 NOTE — Progress Notes (Signed)
PCP - [name], NP Cardiologist - Dr. Raechel Chute   Chest x-ray - N/A EKG - 07/09/18 Stress Test -denies  ECHO - denies Cardiac Cath - denies  Sleep Study - denies   Pt states she is pre-diabetic but takes Metformin daily. Pt denies checking CBG at home or having the materials to do so. Pt states she only gets her blood sugar checked at her yearly check-ups about 2x a year.   Blood Thinner Instructions: N/A Aspirin Instructions: N/A  Anesthesia review: Yes; per surgeon orders.   Patient denies shortness of breath, fever, cough and chest pain at PAT appointment   Patient verbalized understanding of instructions that were given to them at the PAT appointment. Patient was also instructed that they will need to review over the PAT instructions again at home before surgery.

## 2018-07-12 NOTE — Progress Notes (Signed)
Anesthesia Chart Review:  Case:  [id number] Date/Time:  07/14/18 0815   Procedures:      Anterior lateral lumbar fusion L2-4 (N/A ) - 7 hrs     Posterior spinal fusion interbody L2-L5 (N/A )   Anesthesia type:  General   Pre-op diagnosis:  Flat back deformitiy, degenerative disc disease   Location:  [facility] OR ROOM 04 / [facility]   Surgeon:  Melina Schools, MD      DISCUSSION: 72 yo female never smoker for above procedure. Pertinent hx includes HTN, heart murmur, DM, melanoma (1978) post-op N/V. S/p C4-7 ACDF 03/18/2017.  Asked to review per Dr. Rolena Infante order. Stress echo from 2017 showed no evidence of ischemia with EF 60% and grade 2 dd. Pt had ACDF in 9509 without complication, anesthesia notes in Epic.   Anticipate she can proceed with surgery as planned barring acute status change.  VS: BP (!) 176/61   Pulse (!) 56   Temp 36.8 C (Oral)   Ht 5\' 3"  (1.6 m)   Wt 75.9 kg   SpO2 100%   BMI 29.65 kg/m   PROVIDERS: [name], FNP is PCP  Raechel Chute, MD is Cardiologist, pt was seen for DOE and murmur. Per notes dated 01/28/2017 her DOE had basically resolved and murmur was eval'd with echo that showed no structural heart disease.   LABS: Labs reviewed: Acceptable for surgery. (all labs ordered are listed, but only abnormal results are displayed)  Labs Reviewed  HEMOGLOBIN A1C - Abnormal; Notable for the following components:      Result Value   Hgb A1c MFr Bld 5.8 (*)    All other components within normal limits  BASIC METABOLIC PANEL - Abnormal; Notable for the following components:   Potassium 3.4 (*)    Glucose, Bld 108 (*)    BUN 7 (*)    GFR calc non Af Amer 55 (*)    All other components within normal limits  SURGICAL PCR SCREEN  CBC  TYPE AND SCREEN  ABO/RH     IMAGES: N/A   EKG: 07/09/2018: Sinus bradycardia. Minimal voltage criteria for LVH, may be normal variant  CV: Stress echo 11/06/15 [facility], scanned in media tab under correspondence 03/20/2017): 1.  Clinically negative test is a low level of workload. Note is made to the patient is on chronic beta blocker therapy. 2. Electrocardiographically no evidence of induced ischemia given level of workload. 3. Echocardiogram shows normal LV systolic function at rest with EF of about 60% and normal augmentation of contractility post exercise. Almost to complete cavity obliteration in systole. 4. There is no documented dynamic gradient. 5. LA size grossly normal and RV systolic pressure also appears normal. On the other hand, the patient appears to have a grade 2 diastolic filling pattern with indeterminate LV filling pressures by tissue Doppler analysis.  Past Medical History:  Diagnosis Date  . Anxiety   . Arthritis    spondylotic radiculopathy, neck, back, shoulder   . Cancer ([place]) 1978   melanoma- on R leg, removed  . Diabetes mellitus without complication ([place])   . Heart murmur   . History of kidney stones    on imaging, saw 2 stones in kidney & pt. reports that she thinks she has passed one on her own at one time.   . Hypertension   . PONV (postoperative nausea and vomiting)     Past Surgical History:  Procedure Laterality Date  . ABDOMINAL HYSTERECTOMY    . ANTERIOR CERVICAL  DECOMP/DISCECTOMY FUSION N/A 03/18/2017   Procedure: ACDF C4-7;  Surgeon: Melina Schools, MD;  Location: [place];  Service: Orthopedics;  Laterality: N/A;  Requests 4 hours  . CHOLECYSTECTOMY    . EYE SURGERY Bilateral    cataracts /w IOL  . JOINT REPLACEMENT Bilateral    knees  . SHOULDER ARTHROSCOPY Right 2016  . thumb joi    . thumb Joint  Right    thumb joint On R hand replaced  . TOTAL THYROIDECTOMY      MEDICATIONS: . atorvastatin (LIPITOR) 20 MG tablet  . carvedilol (COREG) 25 MG tablet  . chlorthalidone (HYGROTON) 25 MG tablet  . Cholecalciferol (VITAMIN D3) 2000 units TABS  . escitalopram (LEXAPRO) 20 MG tablet  . ferrous sulfate 325 (65 FE) MG tablet  . levothyroxine (SYNTHROID, LEVOTHROID) 50  MCG tablet  . LORazepam (ATIVAN) 1 MG tablet  . metFORMIN (GLUCOPHAGE) 500 MG tablet  . naproxen sodium (ALEVE) 220 MG tablet  . omeprazole (PRILOSEC OTC) 20 MG tablet  . Polyethyl Glycol-Propyl Glycol (SYSTANE OP)  . potassium chloride SA (K-DUR,KLOR-CON) 20 MEQ tablet   No current facility-administered medications for this encounter.     Wynonia Musty [facility] Short Stay Center/Anesthesiology Phone [phone] 07/12/2018 11:01 AM

## 2018-07-13 NOTE — Anesthesia Preprocedure Evaluation (Addendum)
Anesthesia Evaluation  Patient identified by MRN, date of birth, ID band Patient awake    Reviewed: Allergy & Precautions, H&P , NPO status , Patient's Chart, lab work & pertinent test results, reviewed documented beta blocker date and time   History of Anesthesia Complications (+) PONV and history of anesthetic complications  Airway Mallampati: II  TM Distance: >3 FB Neck ROM: full    Dental no notable dental hx. (+) Teeth Intact, Dental Advisory Given   Pulmonary neg pulmonary ROS,    Pulmonary exam normal breath sounds clear to auscultation       Cardiovascular hypertension, Pt. on medications and Pt. on home beta blockers Normal cardiovascular exam+ Valvular Problems/Murmurs  Rhythm:regular Rate:Normal + Systolic murmurs    Neuro/Psych PSYCHIATRIC DISORDERS Anxiety    GI/Hepatic negative GI ROS, Neg liver ROS,   Endo/Other  diabetes, Type 2, Oral Hypoglycemic Agents  Renal/GU negative Renal ROS     Musculoskeletal  (+) Arthritis , Osteoarthritis,    Abdominal Normal abdominal exam  (+)   Peds  Hematology negative hematology ROS (+)   Anesthesia Other Findings HTN DM Stress echo 11/06/15 [facility]): 1. Clinically negative test is a low level of workload. Note is made to the patient is on chronic beta blocker therapy. 2. Electrocardiographically no evidence of induced ischemia given level of workload. 3. Echocardiogram shows normal LV systolic function at rest with EF of about 60% and normal augmentation of contractility post exercise. Almost to complete cavity obliteration in systole. 4. There is no documented dynamic gradient. 5. LA size grossly normal and RV systolic pressure also appears normal. On the other hand, the patient appears to have a grade 2 diastolic filling pattern with indeterminate LV filling pressures by tissue Doppler analysis  Reproductive/Obstetrics                            Anesthesia Physical  Anesthesia Plan  ASA: III  Anesthesia Plan: General   Post-op Pain Management:    Induction: Intravenous  PONV Risk Score and Plan: 4 or greater and Ondansetron, Dexamethasone and Treatment may vary due to age or medical condition  Airway Management Planned: Oral ETT  Additional Equipment:   Intra-op Plan:   Post-operative Plan: Extubation in OR  Informed Consent: I have reviewed the patients History and Physical, chart, labs and discussed the procedure including the risks, benefits and alternatives for the proposed anesthesia with the patient or authorized representative who has indicated his/her understanding and acceptance.   Dental advisory given  Plan Discussed with: CRNA, Surgeon and Anesthesiologist  Anesthesia Plan Comments: (  )      Anesthesia Quick Evaluation

## 2018-07-14 ENCOUNTER — Inpatient Hospital Stay (HOSPITAL_COMMUNITY): Payer: Medicare Other

## 2018-07-14 ENCOUNTER — Inpatient Hospital Stay (HOSPITAL_COMMUNITY): Payer: Medicare Other | Admitting: Anesthesiology

## 2018-07-14 ENCOUNTER — Encounter (HOSPITAL_COMMUNITY)
Admission: RE | Disposition: A | Discharge status: Home Health | Payer: Self-pay | Source: Home / Self Care | Attending: Orthopedic Surgery

## 2018-07-14 ENCOUNTER — Inpatient Hospital Stay (HOSPITAL_COMMUNITY)
Admission: RE | Admit: 2018-07-14 | Discharge: 2018-07-16 | DRG: 455 | Disposition: A | Discharge status: Home Health | Payer: Medicare Other | Attending: Orthopedic Surgery | Admitting: Orthopedic Surgery

## 2018-07-14 ENCOUNTER — Inpatient Hospital Stay (HOSPITAL_COMMUNITY): Payer: Medicare Other | Admitting: Physician Assistant

## 2018-07-14 ENCOUNTER — Encounter (HOSPITAL_COMMUNITY): Payer: Self-pay

## 2018-07-14 DIAGNOSIS — M1990 Unspecified osteoarthritis, unspecified site: Secondary | ICD-10-CM | POA: Diagnosis present

## 2018-07-14 DIAGNOSIS — E785 Hyperlipidemia, unspecified: Secondary | ICD-10-CM | POA: Diagnosis present

## 2018-07-14 DIAGNOSIS — M48062 Spinal stenosis, lumbar region with neurogenic claudication: Secondary | ICD-10-CM | POA: Diagnosis present

## 2018-07-14 DIAGNOSIS — Z419 Encounter for procedure for purposes other than remedying health state, unspecified: Secondary | ICD-10-CM

## 2018-07-14 DIAGNOSIS — M4036 Flatback syndrome, lumbar region: Secondary | ICD-10-CM | POA: Diagnosis present

## 2018-07-14 DIAGNOSIS — Z90710 Acquired absence of both cervix and uterus: Secondary | ICD-10-CM

## 2018-07-14 DIAGNOSIS — Z981 Arthrodesis status: Secondary | ICD-10-CM

## 2018-07-14 DIAGNOSIS — I10 Essential (primary) hypertension: Secondary | ICD-10-CM | POA: Diagnosis present

## 2018-07-14 DIAGNOSIS — E119 Type 2 diabetes mellitus without complications: Secondary | ICD-10-CM | POA: Diagnosis present

## 2018-07-14 DIAGNOSIS — Z87442 Personal history of urinary calculi: Secondary | ICD-10-CM | POA: Diagnosis not present

## 2018-07-14 DIAGNOSIS — Z85820 Personal history of malignant melanoma of skin: Secondary | ICD-10-CM

## 2018-07-14 DIAGNOSIS — M545 Low back pain: Secondary | ICD-10-CM | POA: Diagnosis present

## 2018-07-14 DIAGNOSIS — M5136 Other intervertebral disc degeneration, lumbar region: Secondary | ICD-10-CM | POA: Diagnosis present

## 2018-07-14 HISTORY — PX: ANTERIOR LAT LUMBAR FUSION: SHX1168

## 2018-07-14 LAB — GLUCOSE, CAPILLARY
GLUCOSE-CAPILLARY: 117 mg/dL — AB (ref 70–99)
GLUCOSE-CAPILLARY: 137 mg/dL — AB (ref 70–99)
GLUCOSE-CAPILLARY: 180 mg/dL — AB (ref 70–99)

## 2018-07-14 SURGERY — ANTERIOR LATERAL LUMBAR FUSION 2 LEVELS
Anesthesia: General

## 2018-07-14 MED ORDER — MAGNESIUM CITRATE PO SOLN: 1.0000 | Freq: Once | ORAL | Status: DC | PRN

## 2018-07-14 MED ORDER — PROMETHAZINE HCL 25 MG/ML IJ SOLN: 6.2500 mg | INTRAMUSCULAR | Status: DC | PRN

## 2018-07-14 MED ORDER — METFORMIN HCL 500 MG PO TABS
500.0000 mg | ORAL_TABLET | Freq: Every day | ORAL | Status: DC
  Administered 2018-07-14: 500 mg via ORAL
  Filled 2018-07-14 (×2): qty 1

## 2018-07-14 MED ORDER — PROPOFOL 10 MG/ML IV BOLUS
INTRAVENOUS | Status: DC | PRN
  Administered 2018-07-14: 150 mg via INTRAVENOUS
  Administered 2018-07-14: 50 mg via INTRAVENOUS

## 2018-07-14 MED ORDER — BUPIVACAINE-EPINEPHRINE (PF) 0.25% -1:200000 IJ SOLN
INTRAMUSCULAR | Status: AC
  Filled 2018-07-14: qty 30

## 2018-07-14 MED ORDER — MENTHOL 3 MG MT LOZG: 1.0000 | LOZENGE | OROMUCOSAL | Status: DC | PRN

## 2018-07-14 MED ORDER — ESCITALOPRAM OXALATE 20 MG PO TABS
20.0000 mg | ORAL_TABLET | Freq: Every day | ORAL | Status: DC
  Administered 2018-07-15 – 2018-07-16 (×2): 20 mg via ORAL
  Filled 2018-07-14 (×2): qty 1

## 2018-07-14 MED ORDER — CEFAZOLIN SODIUM-DEXTROSE 2-4 GM/100ML-% IV SOLN
2.0000 g | Freq: Three times a day (TID) | INTRAVENOUS | Status: AC
  Administered 2018-07-14: 2 g via INTRAVENOUS
  Filled 2018-07-14 (×2): qty 100

## 2018-07-14 MED ORDER — LACTATED RINGERS IV SOLN
INTRAVENOUS | Status: DC | PRN
  Administered 2018-07-14 (×4): via INTRAVENOUS

## 2018-07-14 MED ORDER — SODIUM CHLORIDE 0.9 % IV SOLN
INTRAVENOUS | Status: DC | PRN
  Administered 2018-07-14: 20 ug/min via INTRAVENOUS

## 2018-07-14 MED ORDER — HEMOSTATIC AGENTS (NO CHARGE) OPTIME
TOPICAL | Status: DC | PRN
  Administered 2018-07-14: 1

## 2018-07-14 MED ORDER — ONDANSETRON HCL 4 MG/2ML IJ SOLN: 4.0000 mg | Freq: Four times a day (QID) | INTRAMUSCULAR | Status: DC | PRN

## 2018-07-14 MED ORDER — LIDOCAINE 2% (20 MG/ML) 5 ML SYRINGE
INTRAMUSCULAR | Status: AC
  Filled 2018-07-14: qty 5

## 2018-07-14 MED ORDER — ONDANSETRON HCL 4 MG/2ML IJ SOLN
INTRAMUSCULAR | Status: AC
  Filled 2018-07-14: qty 2

## 2018-07-14 MED ORDER — HYDROMORPHONE HCL 1 MG/ML IJ SOLN
0.2500 mg | INTRAMUSCULAR | Status: DC | PRN
  Administered 2018-07-14 (×2): 0.5 mg via INTRAVENOUS

## 2018-07-14 MED ORDER — METHOCARBAMOL 500 MG PO TABS
ORAL_TABLET | ORAL | Status: AC
  Filled 2018-07-14: qty 1

## 2018-07-14 MED ORDER — POLYETHYLENE GLYCOL 3350 17 G PO PACK: 17.0000 g | PACK | Freq: Every day | ORAL | Status: DC | PRN

## 2018-07-14 MED ORDER — ONDANSETRON 4 MG PO TBDP: 4.0000 mg | ORAL_TABLET | Freq: Three times a day (TID) | ORAL | 0 refills | Status: DC | PRN

## 2018-07-14 MED ORDER — SUCCINYLCHOLINE CHLORIDE 200 MG/10ML IV SOSY
PREFILLED_SYRINGE | INTRAVENOUS | Status: DC | PRN
  Administered 2018-07-14: 120 mg via INTRAVENOUS

## 2018-07-14 MED ORDER — OXYCODONE HCL 5 MG PO TABS
10.0000 mg | ORAL_TABLET | ORAL | Status: DC | PRN
  Administered 2018-07-14 – 2018-07-16 (×9): 10 mg via ORAL
  Filled 2018-07-14 (×11): qty 2

## 2018-07-14 MED ORDER — MEPERIDINE HCL 50 MG/ML IJ SOLN: 6.2500 mg | INTRAMUSCULAR | Status: DC | PRN

## 2018-07-14 MED ORDER — ONDANSETRON HCL 4 MG PO TABS: 4.0000 mg | ORAL_TABLET | Freq: Four times a day (QID) | ORAL | Status: DC | PRN

## 2018-07-14 MED ORDER — PROPOFOL 500 MG/50ML IV EMUL
INTRAVENOUS | Status: DC | PRN
  Administered 2018-07-14 (×2): 50 ug/kg/min via INTRAVENOUS

## 2018-07-14 MED ORDER — LIDOCAINE HCL (CARDIAC) PF 100 MG/5ML IV SOSY
PREFILLED_SYRINGE | INTRAVENOUS | Status: DC | PRN
  Administered 2018-07-14: 100 mg via INTRAVENOUS

## 2018-07-14 MED ORDER — HYDRALAZINE HCL 20 MG/ML IJ SOLN
INTRAMUSCULAR | Status: DC | PRN
  Administered 2018-07-14 (×2): 5 mg via INTRAVENOUS

## 2018-07-14 MED ORDER — 0.9 % SODIUM CHLORIDE (POUR BTL) OPTIME
TOPICAL | Status: DC | PRN
  Administered 2018-07-14 (×3): 1000 mL

## 2018-07-14 MED ORDER — FENTANYL CITRATE (PF) 250 MCG/5ML IJ SOLN
INTRAMUSCULAR | Status: AC
  Filled 2018-07-14: qty 5

## 2018-07-14 MED ORDER — BUPIVACAINE-EPINEPHRINE 0.25% -1:200000 IJ SOLN
INTRAMUSCULAR | Status: DC | PRN
  Administered 2018-07-14 (×2): 10 mL

## 2018-07-14 MED ORDER — MIDAZOLAM HCL 5 MG/5ML IJ SOLN
INTRAMUSCULAR | Status: DC | PRN
  Administered 2018-07-14: 1 mg via INTRAVENOUS

## 2018-07-14 MED ORDER — DEXAMETHASONE SODIUM PHOSPHATE 10 MG/ML IJ SOLN
INTRAMUSCULAR | Status: DC | PRN
  Administered 2018-07-14: 10 mg via INTRAVENOUS

## 2018-07-14 MED ORDER — KETOROLAC TROMETHAMINE 15 MG/ML IJ SOLN
INTRAMUSCULAR | Status: AC
  Filled 2018-07-14: qty 1

## 2018-07-14 MED ORDER — OXYCODONE HCL 5 MG PO TABS
5.0000 mg | ORAL_TABLET | ORAL | Status: DC | PRN
  Administered 2018-07-14: 5 mg via ORAL

## 2018-07-14 MED ORDER — EPHEDRINE 5 MG/ML INJ
INTRAVENOUS | Status: AC
  Filled 2018-07-14: qty 10

## 2018-07-14 MED ORDER — DOCUSATE SODIUM 100 MG PO CAPS
100.0000 mg | ORAL_CAPSULE | Freq: Two times a day (BID) | ORAL | Status: DC
  Administered 2018-07-15 – 2018-07-16 (×3): 100 mg via ORAL
  Filled 2018-07-14 (×4): qty 1

## 2018-07-14 MED ORDER — EPHEDRINE SULFATE-NACL 50-0.9 MG/10ML-% IV SOSY
PREFILLED_SYRINGE | INTRAVENOUS | Status: DC | PRN
  Administered 2018-07-14 (×3): 10 mg via INTRAVENOUS

## 2018-07-14 MED ORDER — PHENOL 1.4 % MT LIQD: 1.0000 | OROMUCOSAL | Status: DC | PRN

## 2018-07-14 MED ORDER — ACETAMINOPHEN 650 MG RE SUPP: 650.0000 mg | RECTAL | Status: DC | PRN

## 2018-07-14 MED ORDER — LEVOTHYROXINE SODIUM 100 MCG PO TABS
50.0000 ug | ORAL_TABLET | Freq: Every day | ORAL | Status: DC
  Administered 2018-07-15 – 2018-07-16 (×2): 50 ug via ORAL
  Filled 2018-07-14 (×2): qty 1

## 2018-07-14 MED ORDER — SODIUM CHLORIDE 0.9% FLUSH: 3.0000 mL | INTRAVENOUS | Status: DC | PRN

## 2018-07-14 MED ORDER — LACTATED RINGERS IV SOLN: INTRAVENOUS | Status: DC

## 2018-07-14 MED ORDER — ACETAMINOPHEN 325 MG PO TABS
650.0000 mg | ORAL_TABLET | ORAL | Status: DC | PRN
  Administered 2018-07-14 – 2018-07-16 (×3): 650 mg via ORAL
  Filled 2018-07-14 (×3): qty 2

## 2018-07-14 MED ORDER — FENTANYL CITRATE (PF) 100 MCG/2ML IJ SOLN
INTRAMUSCULAR | Status: DC | PRN
  Administered 2018-07-14: 150 ug via INTRAVENOUS
  Administered 2018-07-14 (×6): 50 ug via INTRAVENOUS

## 2018-07-14 MED ORDER — METHOCARBAMOL 500 MG PO TABS
500.0000 mg | ORAL_TABLET | Freq: Four times a day (QID) | ORAL | Status: DC | PRN
  Administered 2018-07-14 – 2018-07-16 (×4): 500 mg via ORAL
  Filled 2018-07-14 (×3): qty 1

## 2018-07-14 MED ORDER — PHENYLEPHRINE 40 MCG/ML (10ML) SYRINGE FOR IV PUSH (FOR BLOOD PRESSURE SUPPORT)
PREFILLED_SYRINGE | INTRAVENOUS | Status: DC | PRN
  Administered 2018-07-14: 20 ug via INTRAVENOUS

## 2018-07-14 MED ORDER — SODIUM CHLORIDE 0.9% FLUSH: 3.0000 mL | Freq: Two times a day (BID) | INTRAVENOUS | Status: DC

## 2018-07-14 MED ORDER — OXYCODONE HCL 5 MG PO TABS
ORAL_TABLET | ORAL | Status: AC
  Filled 2018-07-14: qty 1

## 2018-07-14 MED ORDER — OMEPRAZOLE MAGNESIUM 20 MG PO TBEC: 20.0000 mg | DELAYED_RELEASE_TABLET | Freq: Every day | ORAL | Status: DC

## 2018-07-14 MED ORDER — KETOROLAC TROMETHAMINE 15 MG/ML IJ SOLN
15.0000 mg | Freq: Once | INTRAMUSCULAR | Status: AC | PRN
  Administered 2018-07-14: 15 mg via INTRAVENOUS

## 2018-07-14 MED ORDER — MORPHINE SULFATE (PF) 2 MG/ML IV SOLN
1.0000 mg | INTRAVENOUS | Status: DC | PRN
  Filled 2018-07-14: qty 1

## 2018-07-14 MED ORDER — SUCCINYLCHOLINE CHLORIDE 200 MG/10ML IV SOSY
PREFILLED_SYRINGE | INTRAVENOUS | Status: AC
  Filled 2018-07-14: qty 10

## 2018-07-14 MED ORDER — MIDAZOLAM HCL 2 MG/2ML IJ SOLN
INTRAMUSCULAR | Status: AC
  Filled 2018-07-14: qty 2

## 2018-07-14 MED ORDER — METHOCARBAMOL 500 MG PO TABS: 500.0000 mg | ORAL_TABLET | Freq: Three times a day (TID) | ORAL | 0 refills | Status: DC

## 2018-07-14 MED ORDER — HYDROMORPHONE HCL 1 MG/ML IJ SOLN
INTRAMUSCULAR | Status: AC
  Filled 2018-07-14: qty 1

## 2018-07-14 MED ORDER — METHOCARBAMOL 1000 MG/10ML IJ SOLN
500.0000 mg | Freq: Four times a day (QID) | INTRAVENOUS | Status: DC | PRN
  Administered 2018-07-14: 500 mg via INTRAVENOUS
  Filled 2018-07-14: qty 5
  Filled 2018-07-14: qty 500

## 2018-07-14 MED ORDER — SODIUM CHLORIDE 0.9 % IV SOLN: 250.0000 mL | INTRAVENOUS | Status: DC

## 2018-07-14 MED ORDER — CEFAZOLIN SODIUM-DEXTROSE 2-4 GM/100ML-% IV SOLN
2.0000 g | INTRAVENOUS | Status: AC
  Administered 2018-07-14: 2 g via INTRAVENOUS
  Filled 2018-07-14: qty 100

## 2018-07-14 MED ORDER — OXYCODONE-ACETAMINOPHEN 10-325 MG PO TABS: 1.0000 | ORAL_TABLET | ORAL | 0 refills | Status: AC | PRN

## 2018-07-14 MED ORDER — LORAZEPAM 0.5 MG PO TABS
1.0000 mg | ORAL_TABLET | Freq: Two times a day (BID) | ORAL | Status: DC
  Administered 2018-07-15 – 2018-07-16 (×3): 1 mg via ORAL
  Filled 2018-07-14 (×3): qty 2

## 2018-07-14 MED ORDER — ATORVASTATIN CALCIUM 20 MG PO TABS
20.0000 mg | ORAL_TABLET | Freq: Every evening | ORAL | Status: DC
  Administered 2018-07-14 – 2018-07-15 (×2): 20 mg via ORAL
  Filled 2018-07-14 (×2): qty 1

## 2018-07-14 MED ORDER — PANTOPRAZOLE SODIUM 40 MG PO TBEC
40.0000 mg | DELAYED_RELEASE_TABLET | Freq: Every day | ORAL | Status: DC
  Administered 2018-07-15 – 2018-07-16 (×2): 40 mg via ORAL
  Filled 2018-07-14 (×2): qty 1

## 2018-07-14 MED ORDER — THROMBIN (RECOMBINANT) 20000 UNITS EX SOLR
CUTANEOUS | Status: AC
  Filled 2018-07-14: qty 20000

## 2018-07-14 MED ORDER — PROPOFOL 10 MG/ML IV BOLUS
INTRAVENOUS | Status: AC
  Filled 2018-07-14: qty 20

## 2018-07-14 MED ORDER — DEXAMETHASONE SODIUM PHOSPHATE 10 MG/ML IJ SOLN
INTRAMUSCULAR | Status: AC
  Filled 2018-07-14: qty 1

## 2018-07-14 MED ORDER — ONDANSETRON HCL 4 MG/2ML IJ SOLN
INTRAMUSCULAR | Status: DC | PRN
  Administered 2018-07-14: 4 mg via INTRAVENOUS

## 2018-07-14 SURGICAL SUPPLY — 101 items
BLADE SURG 10 STRL SS (BLADE) ×3 IMPLANT
BONE MATRIX OSTEOCEL PRO MED (Bone Implant) ×6 IMPLANT
BONE VIVIGEN FORMABLE 5.4CC (Bone Implant) ×6 IMPLANT
BUR EGG ELITE 4.0 (BURR) IMPLANT
BUR EGG ELITE 4.0MM (BURR)
BUR NEURO DRILL SOFT 3.0X3.8M (BURR) ×3 IMPLANT
CABLE BIPOLOR RESECTION CORD (MISCELLANEOUS) ×3 IMPLANT
CAGE MODULUS XL 12X18X50 - 10 (Cage) ×9 IMPLANT
CLIP NEUROVISION LG (CLIP) ×3 IMPLANT
CLOSURE STERI-STRIP 1/2X4 (GAUZE/BANDAGES/DRESSINGS) ×1
CLOSURE WOUND 1/2 X4 (GAUZE/BANDAGES/DRESSINGS) ×1
CLSR STERI-STRIP ANTIMIC 1/2X4 (GAUZE/BANDAGES/DRESSINGS) ×2 IMPLANT
CORDS BIPOLAR (ELECTRODE) ×3 IMPLANT
COVER MAYO STAND STRL (DRAPES) ×6 IMPLANT
COVER SURGICAL LIGHT HANDLE (MISCELLANEOUS) ×6 IMPLANT
DECANTER SPIKE VIAL GLASS SM (MISCELLANEOUS) ×3 IMPLANT
DERMABOND ADVANCED (GAUZE/BANDAGES/DRESSINGS) ×2
DERMABOND ADVANCED .7 DNX12 (GAUZE/BANDAGES/DRESSINGS) ×1 IMPLANT
DRAPE C-ARM 42X72 X-RAY (DRAPES) ×6 IMPLANT
DRAPE C-ARMOR (DRAPES) ×6 IMPLANT
DRAPE ORTHO SPLIT 77X108 STRL (DRAPES) ×2
DRAPE POUCH INSTRU U-SHP 10X18 (DRAPES) ×6 IMPLANT
DRAPE SURG 17X23 STRL (DRAPES) ×3 IMPLANT
DRAPE SURG ORHT 6 SPLT 77X108 (DRAPES) ×1 IMPLANT
DRAPE U-SHAPE 47X51 STRL (DRAPES) ×9 IMPLANT
DRESSING OPSITE X SMALL 2X3 (GAUZE/BANDAGES/DRESSINGS) ×3 IMPLANT
DRSG OPSITE POSTOP 4X8 (GAUZE/BANDAGES/DRESSINGS) ×6 IMPLANT
DURAPREP 26ML APPLICATOR (WOUND CARE) ×6 IMPLANT
ELECT BLADE 4.0 EZ CLEAN MEGAD (MISCELLANEOUS) ×3
ELECT BLADE 6.5 EXT (BLADE) ×3 IMPLANT
ELECT CAUTERY BLADE 6.4 (BLADE) ×6 IMPLANT
ELECT PENCIL ROCKER SW 15FT (MISCELLANEOUS) ×6 IMPLANT
ELECT REM PT RETURN 9FT ADLT (ELECTROSURGICAL) ×6
ELECTRODE BLDE 4.0 EZ CLN MEGD (MISCELLANEOUS) ×1 IMPLANT
ELECTRODE REM PT RTRN 9FT ADLT (ELECTROSURGICAL) ×2 IMPLANT
GAUZE 4X4 16PLY RFD (DISPOSABLE) ×3 IMPLANT
GLOVE BIO SURGEON STRL SZ 6.5 (GLOVE) ×4 IMPLANT
GLOVE BIO SURGEONS STRL SZ 6.5 (GLOVE) ×2
GLOVE BIOGEL PI IND STRL 6.5 (GLOVE) ×2 IMPLANT
GLOVE BIOGEL PI IND STRL 8.5 (GLOVE) ×2 IMPLANT
GLOVE BIOGEL PI INDICATOR 6.5 (GLOVE) ×4
GLOVE BIOGEL PI INDICATOR 8.5 (GLOVE) ×4
GLOVE SS BIOGEL STRL SZ 8.5 (GLOVE) ×2 IMPLANT
GLOVE SUPERSENSE BIOGEL SZ 8.5 (GLOVE) ×4
GOWN STRL REUS W/ TWL LRG LVL3 (GOWN DISPOSABLE) ×2 IMPLANT
GOWN STRL REUS W/TWL 2XL LVL3 (GOWN DISPOSABLE) ×6 IMPLANT
GOWN STRL REUS W/TWL LRG LVL3 (GOWN DISPOSABLE) ×4
GUIDEWIRE NITINOL BEVEL TIP (WIRE) ×24 IMPLANT
KIT BASIN OR (CUSTOM PROCEDURE TRAY) ×6 IMPLANT
KIT DILATOR XLIF 5 (KITS) ×2 IMPLANT
KIT POSITION SURG JACKSON T1 (MISCELLANEOUS) ×3 IMPLANT
KIT SURGICAL ACCESS MAXCESS 4 (KITS) ×3 IMPLANT
KIT TURNOVER KIT B (KITS) ×6 IMPLANT
KIT XLIF (KITS) ×1
MODULE EMG NEEDLE SSEP NVM5 (NEEDLE) ×3 IMPLANT
MODULE NVM5 NEXT GEN EMG (NEEDLE) ×6 IMPLANT
NEEDLE 22X1 1/2 (OR ONLY) (NEEDLE) ×6 IMPLANT
NEEDLE I-PASS III (NEEDLE) ×3 IMPLANT
NEEDLE SPNL 18GX3.5 QUINCKE PK (NEEDLE) ×6 IMPLANT
NS IRRIG 1000ML POUR BTL (IV SOLUTION) ×6 IMPLANT
PACK LAMINECTOMY ORTHO (CUSTOM PROCEDURE TRAY) ×6 IMPLANT
PACK UNIVERSAL I (CUSTOM PROCEDURE TRAY) ×6 IMPLANT
PAD ARMBOARD 7.5X6 YLW CONV (MISCELLANEOUS) ×12 IMPLANT
PATTIES SURGICAL .5 X.5 (GAUZE/BANDAGES/DRESSINGS) IMPLANT
PATTIES SURGICAL .5 X1 (DISPOSABLE) ×3 IMPLANT
POSITIONER HEAD PRONE TRACH (MISCELLANEOUS) ×3 IMPLANT
PROBE BALL TIP NVM5 SNG USE (BALLOONS) ×3 IMPLANT
ROD RELINE MAS LORD 5.5X90MM (Rod) ×6 IMPLANT
SCREW LOCK RELINE 5.5 TULIP (Screw) ×27 IMPLANT
SCREW RELINE MAS POLY 6.5X40MM (Screw) ×12 IMPLANT
SCREW SPINAL REDUCE 5.5X40 C2 (Screw) ×12 IMPLANT
SPONGE INTESTINAL PEANUT (DISPOSABLE) ×6 IMPLANT
SPONGE LAP 4X18 RFD (DISPOSABLE) ×9 IMPLANT
SPONGE SURGIFOAM ABS GEL 100 (HEMOSTASIS) ×3 IMPLANT
STAPLER VISISTAT 35W (STAPLE) ×3 IMPLANT
STRIP CLOSURE SKIN 1/2X4 (GAUZE/BANDAGES/DRESSINGS) ×2 IMPLANT
SURGIFLO W/THROMBIN 8M KIT (HEMOSTASIS) IMPLANT
SUT BONE WAX W31G (SUTURE) ×6 IMPLANT
SUT MNCRL AB 3-0 PS2 18 (SUTURE) ×3 IMPLANT
SUT MON AB 3-0 SH 27 (SUTURE) ×2
SUT MON AB 3-0 SH27 (SUTURE) ×1 IMPLANT
SUT PROLENE 5 0 C 1 24 (SUTURE) IMPLANT
SUT SILK 2 0 TIES 10X30 (SUTURE) ×3 IMPLANT
SUT SILK 3 0 TIES 10X30 (SUTURE) ×3 IMPLANT
SUT VIC AB 1 CT1 18XCR BRD 8 (SUTURE) ×2 IMPLANT
SUT VIC AB 1 CT1 27 (SUTURE) ×4
SUT VIC AB 1 CT1 27XBRD ANBCTR (SUTURE) ×2 IMPLANT
SUT VIC AB 1 CT1 8-18 (SUTURE) ×4
SUT VIC AB 1 CTX 36 (SUTURE) ×4
SUT VIC AB 1 CTX36XBRD ANBCTR (SUTURE) ×2 IMPLANT
SUT VIC AB 2-0 CT1 18 (SUTURE) ×9 IMPLANT
SYR BULB IRRIGATION 50ML (SYRINGE) ×6 IMPLANT
SYR CONTROL 10ML LL (SYRINGE) ×6 IMPLANT
TAPE CLOTH 4X10 WHT NS (GAUZE/BANDAGES/DRESSINGS) ×6 IMPLANT
TOWEL GREEN STERILE (TOWEL DISPOSABLE) ×3 IMPLANT
TOWEL GREEN STERILE FF (TOWEL DISPOSABLE) ×3 IMPLANT
TOWEL OR 17X24 6PK STRL BLUE (TOWEL DISPOSABLE) ×3 IMPLANT
TOWEL OR 17X26 10 PK STRL BLUE (TOWEL DISPOSABLE) ×6 IMPLANT
TRAY FOLEY CATH SILVER 16FR (SET/KITS/TRAYS/PACK) ×3 IMPLANT
TRAY FOLEY MTR SLVR 16FR STAT (SET/KITS/TRAYS/PACK) ×3 IMPLANT
YANKAUER SUCT BULB TIP NO VENT (SUCTIONS) ×3 IMPLANT

## 2018-07-14 NOTE — Transfer of Care (Signed)
Immediate Anesthesia Transfer of Care Note  Patient: Heidi Thompson  Procedure(s) Performed: Anterior lateral lumbar fusion L2-4 (N/A ) Posterior spinal fusion  with screws and rods L2-L5 (N/A )  Patient Location: PACU  Anesthesia Type:General  Level of Consciousness: drowsy and patient cooperative  Airway & Oxygen Therapy: Patient Spontanous Breathing and Patient connected to nasal cannula oxygen  Post-op Assessment: Report given to RN, Post -op Vital signs reviewed and stable and Patient moving all extremities X 4  Post vital signs: Reviewed and stable  Last Vitals:  Vitals Value Taken Time  BP 120/89 07/14/2018  3:25 PM  Temp    Pulse 66 07/14/2018  3:33 PM  Resp 14 07/14/2018  3:33 PM  SpO2 97 % 07/14/2018  3:33 PM  Vitals shown include unvalidated device data.  Last Pain:  Vitals:   07/14/18 0718  TempSrc:   PainSc: 6          Complications: No apparent anesthesia complications

## 2018-07-14 NOTE — Discharge Instructions (Signed)
Spinal Fusion, Care After °These instructions give you information about caring for yourself after your procedure. Your doctor may also give you more specific instructions. Call your doctor if you have any problems or questions after your procedure. °Follow these instructions at home: °Medicines °· Take over-the-counter and prescription medicines only as told by your doctor. These include any medicines for pain. °· Do not drive for 24 hours if you received a sedative. °· Do not drive or use heavy machinery while taking prescription pain medicine. °· If you were prescribed an antibiotic medicine, take it as told by your doctor. Do not stop taking the antibiotic even if you start to feel better. °Surgical Cut (Incision) Care °· Follow instructions from your doctor about how to take care of your surgical cut. Make sure you: °? Wash your hands with soap and water before you change your bandage (dressing). If you cannot use soap and water, use hand sanitizer. °? Change your bandage as told by your doctor. °? Leave stitches (sutures), skin glue, or skin tape (adhesive) strips in place. They may need to stay in place for 2 weeks or longer. If tape strips get loose and curl up, you may trim the loose edges. Do not remove tape strips completely unless your doctor says it is okay. °· Keep your surgical cut clean and dry. Do not take baths, swim, or use a hot tub until your doctor says it is okay. °· Check your surgical cut and the area around it every day for: °? Redness. °? Swelling. °? Fluid. °Physical Activity °· Return to your normal activities as told by your doctor. Ask your doctor what activities are safe for you. Rest and protect your back as much as you can. °· Follow instructions from your doctor about how to move. Use good posture to help your spine heal. °· Do not lift anything that is heavier than 8 lb (3.6 kg) or as told by your doctor until he or she says that it is safe. Do not lift anything over your  head. °· Do not twist or bend at the waist until your doctor says it is okay. °· Avoid pushing or pulling motions. °· Do not sit or lie down in the same position for long periods of time. °· Do not start to exercise until your doctor says it is okay. Ask your doctor what kinds of exercise you can do to make your back stronger. °General instructions °· If you were given a brace, use it as told by your doctor. °· Wear compression stockings as told by your doctor. °· Do not use tobacco products. These include cigarettes, chewing tobacco, or e-cigarettes. If you need help quitting, ask your doctor. °· Keep all follow-up visits as told by your doctor. This is important. This includes any visits with your physical therapist, if this applies. °Contact a doctor if: °· Your pain gets worse. °· Your medicine does not help your pain. °· Your legs or feet become painful or swollen. °· Your surgical cut is red, swollen, or painful. °· You have fluid, blood, or pus coming from your surgical cut. °· You feel sick to your stomach (nauseous). °· You throw up (vomit). °· Your have weakness or loss of feeling (numbness) in your legs that is new or getting worse. °· You have a fever. °· You have trouble controlling when you pee (urinate) or poop (have a bowel movement). °Get help right away if: °· Your pain is very bad. °· You have   chest pain. °· You have trouble breathing. °· You start to have a cough. °These symptoms may be an emergency. Do not wait to see if the symptoms will go away. Get medical help right away. Call your local emergency services (911 in the [place] Do not drive yourself to the hospital. °This information is not intended to replace advice given to you by your health care provider. Make sure you discuss any questions you have with your health care provider. °Document Released: 02/13/2011 Document Revised: 06/17/2016 Document Reviewed: 04/04/2015 °Elsevier Interactive Patient Education © 2018 [employer] ° °

## 2018-07-14 NOTE — Op Note (Signed)
Operative report  Preoperative diagnosis: Degenerative lumbar disc disease L2-L5.  Discogenic back pain, flatback deformity.  Lumbar spinal stenosis with neurogenic claudication  Postoperative diagnosis: Same  Operative procedure: 1.  Lateral interbody fusion L2-3 and L3-4    2.  Posterior lateral arthrodesis L4-5    3.  Posterior fusion with instrumentation L2-L5  First assistant: Ronette Deter, PA  Complications: None  Intraoperative neuro monitoring: No abnormal free running EMG or SSEP activity. All pedicle screws were directly stimulated and there was no adverse activity at greater than 40 mA.  Implants:  Nuvasive MIS pedicle screws, and 3D I fuse intervertebral cages 1. XLIF: 12 x 18 x 50 lordotic intervertebral cages at L2-3 and L3-4. 2.  5.5 x 40 mm length pedicle screws bilaterally at L2 and L3 3.  6.5 x 40 mm length pedicle screws bilaterally at L3 and L4  Allograft: vivogen and osteocel  Retraction time of the psoas: L 2/3: 22 minutes.   L3-4: 24 minutes   Indications: This is a very pleasant active 72 year old woman who is been having significant debilitating low back buttock and bilateral lower extremity pain and dysesthesias.  Her overall quality of life is continued to deteriorate despite appropriate conservative measures.  Preoperative imaging studies demonstrated a flat back deformity with loss of lumbar lordosis, multilevel degenerative disc disease, and spinal stenosis.  Patient's preoperative bone density was normal.  As result of the failure of conservative measures, the loss in quality of life we elected to move forward with surgery.  All appropriate risks benefits and alternatives were discussed with the patient and her husband and consent was obtained.  Operative note Patient was brought the operating room placed by the operating room table.  After successful induction of general anesthesia and endotracheally patient teds SCDs and a Foley were inserted.  The  representative from the neuro monitoring company then applied all appropriate needles and patches for intraoperative SSEP and EMG monitoring.  Patient was then placed supine on the operating room table.  She was then rotated into the lateral decubitus position left side up.  Axillary roll was placed and all bony prominences well-padded.  The patient was secured directly to the table with tape holding her in the appropriate position.  X-ray was used to at confirmed satisfactory position and visualization of the L2-3 and L3-4 levels in both the AP and lateral planes.  With all bony problems well-padded and the patient properly placed a timeout was taken to confirm patient procedure and all important data.    Lateral flank was then prepped and draped in a standard fashion.  Fluoroscopy was then sterilely brought into the field and identified the midportion of the L3 vertebral body I marked this out and in infiltrated the area with quarter percent Marcaine with epinephrine.  Transverse incision was made sharply dissected down to the deep fascia.  I could now visualize the fascia of the external oblique.  A second incision was made 1 fingerbreadth posteriorly and I bluntly dissected down to the retroperitoneal fascia.  I entered into the retroperitoneal fascia and using my finger I bluntly dissected the area mobilizing the adipose tissue.  I could now palpate the undersurface of the rib as well as the surface of the psoas muscle.  I then dissected from the undersurface of the oblique until I could see the my finger in the transverse incision.  I then placed the first dilating probe on my finger and brought it into the retroperitoneal cavity down to  the lateral aspect of the psoas.  I confirmed satisfactory positioning of this pain with respect to the disc space and then I stimulated circumferentially on the psoas to ensure I was not directly compressing or traumatizing the lumbar plexus.  I then advanced through the  psoas to the lateral aspect of the disc space.  I secured the pain in place with a small guidepin.  I then circumferentially dilated into the psoas to ensure satisfactory position of the first trocar I then dilated up sequentially again stimulating with each dilatation.  Once I had the final dilator and I placed the working trocar.  Once this was properly positioned and secured to the table I remove the dilating tubes and I had excellent visualization of the lateral aspect of the L3-4 disc space.  I confirmed using the hand-held probe that I was not traumatizing the lumbar plexus.  Fact identified the plexus in the posterior lateral corner behind by retractor.  At this point with the lumbar plexus protected I proceeded with the discectomy.  10 blade scalpel was used to perform an annulotomy box osteotome along with pituitary Rogers and curettes and Kerrison Rogers discectomy was performed.  The anterior annulus was left in place.  Once I released the contralateral annulus and I removed all the disc material I then sequentially used my trial spacers.  Ultimately I felt as though the size 12 x 18 lordotic cage gave the best overall restoration of lordosis and intervertebral disc height restoration.  I obtained the actual implant and I packed it with the allograft.  He was then [facility] to the appropriate depth.  The cage was properly seated in both planes and was pleased with his overall position.  At this point with the L3-4 level complete I removed all my retractors in the lab the psoas muscle to recover.  After approximately 10 minutes I then identified the L2-3 level and using the same technique that I did use of L3-4 I placed my dilating tube to expect on the lateral surface of the L2-3 disc space.  Again I then sequentially dilated using my stimulating device until I had placed my final dilating tube.  Again and no point was there any adverse free running EMG activity to suggest trauma to the lumbar plexus.   The working trocar was then placed and secured directly to the disc space.  I again circumferentially stimulated and once I confirmed that I was not traumatizing the lumbar plexus I proceeded with the discectomy.  Annulotomy was performed with a 10 blade scalpel and then using pituitary rongeurs box osteotomes and curettes I removed all of the disc material.  I also released the annulus from the contralateral side.  I then sequentially trial and again use the size 12 x 18 x 50 mm length cage.  Suspect the allograft and malleted to the appropriate depth.  At this point I was very pleased with the restoration of the overall lordosis of the spine and the restoration of the intervertebral heights.  At this point the wounds were copiously irrigated with normal saline anterior hemostasis using bipolar electrocautery.  I removed all the retractors and then closed the fascia of the external break with interrupted #1 Vicryl suture.  I also closed the small incision posteriorly in a layered fashion with interrupted #1 Vicryl suture 2-0 Vicryl suture and 3-0 Monocryl.  The deep fascia was closed with the 2-0 Vicryl and 3-0 Monocryl from the original transverse incision.  Once both incisions were  closed I placed dry dressings.  At this point the patient was transferred to the OR bed and the spine frame was brought into the surgical suite.  The spine frame was attached to the bed in the flat [place] portion was removed.  Patient was then turned prone onto the spine frame.  The abdomen was allowed to be free thereby restoring the overall lumbar lordosis.  This aided greatly in restoring her sagittal alignment.  Once the patient was positioned on the spine frame and all bony prominences were well-padded the posterior spine was prepped and draped in a standard fashion.  A second timeout was taken to confirm this procedure which was the posterior fusion and instrumentation.  Her graph Jamshidi needles were used to identify the  lateral side of the L2 pedicle and a small incision was made advanced the Jamshidi needle down percutaneously to the lateral aspect of the facet complex.  Once I was properly positioned using live fluoroscopy and direct neural stimulation I advanced the Jamshidi needle until I near the medial wall on the AP view.  Once I was nearing this I switched to the lateral view confirming that I was beyond the posterior wall of the vertebral body.  Once I confirmed trajectory advanced the Jamshidi needle into the vertebral body and then placed the guide pin through the center of the Jamshidi needle to cannulate the pedicle.  I then repeated this exact same procedure at L3, L4, and L5, as well as on the contralateral side.  Once all 8 pedicles were cannulated I then connected the L4-5 stab incisions and dissected down to the L5 4 5 facet capsule.  Once I was down at the L4-5 facet capsule I used my high-speed bur and Bovie to resect the capsule and decorticate the facet the transverse process and the pars of L5 4.  I then also decorticated the transverse process of L4 and L5.  This was done bilaterally.  Since there was no interbody cage at the L4-5 level I did want to put a formal posterior lateral arthrodesis in place.  Once I had the posterior lateral corner decorticated placed bone graft (allograft) in the posterior lateral gutter.  Once this was well packed I then began placing my pedicle screws.  Over the guidepins I measured and placed the appropriate size pedicle screws.  All pedicle screws had excellent purchase and x-rays demonstrated satisfactory overall position.  I then directly stimulated all 8 pedicles screws and there was no adverse activity at 40 mA.  With the posterior lateral arthrodesis completed and all 8 pedicle screws in place I then measured and obtained a precontoured rod and secured it to the pedicle screws.  Passed the rod percutaneously across each of the pedicle screws and then used the appropriate  locking caps to the secured in place.  All locking caps were then torqued off according manufacturer standards.  At this point I irrigated the wound copiously normal saline and I took my final x-rays.  Lumbar lordosis was restored and the overall positioning of the hardware was satisfactory.  All 8 pedicle screws as well as intervertebral cages were properly all wounds were copiously irrigated with normal saline and closed in a layered fashion with interrupted #1 Vicryl suture, 2-0 Vicryl suture, 3-0 Monocryl.  Steri-Strips dry dressings were applied and the patient was ultimately extubated transfer the PACU without incident.  The end of the case all needle sponge counts were correct there were no adverse intraoperative events.

## 2018-07-14 NOTE — Brief Op Note (Signed)
07/14/2018  2:43 PM  PATIENT:  Heidi Thompson  72 y.o. female  PRE-OPERATIVE DIAGNOSIS:  Flat back deformitiy, degenerative disc disease  POST-OPERATIVE DIAGNOSIS:  Flat back deformitiy, degenerative disc disease  PROCEDURE:  Procedure(s) with comments: Anterior lateral lumbar fusion L2-4 (N/A) - 7 hrs Posterior spinal fusion  with screws and rods L2-L5 (N/A)  SURGEON:  Surgeon(s) and Role:    Melina Schools, MD - Primary  PHYSICIAN ASSISTANT:   ASSISTANTS: Carmen Mayo   ANESTHESIA:   general  EBL:  100 mL   BLOOD ADMINISTERED:none  DRAINS: none   LOCAL MEDICATIONS USED:  MARCAINE     SPECIMEN:  No Specimen and Mastectomy with SLN  DISPOSITION OF SPECIMEN:  N/A  COUNTS:  YES  TOURNIQUET:  * No tourniquets in log *  DICTATION: .Dragon Dictation  PLAN OF CARE: Admit to inpatient   PATIENT DISPOSITION:  PACU - hemodynamically stable.

## 2018-07-14 NOTE — Anesthesia Procedure Notes (Signed)
Procedure Name: Intubation Date/Time: 07/14/2018 8:46 AM Performed by: Carney Living, CRNA Pre-anesthesia Checklist: Patient identified, Emergency Drugs available, Suction available, Patient being monitored and Timeout performed Patient Re-evaluated:Patient Re-evaluated prior to induction Oxygen Delivery Method: Circle system utilized Preoxygenation: Pre-oxygenation with 100% oxygen Induction Type: IV induction Ventilation: Mask ventilation without difficulty Laryngoscope Size: Mac and 4 Grade View: Grade I Tube type: Oral Tube size: 7.0 mm Number of attempts: 1 Airway Equipment and Method: Stylet Placement Confirmation: ETT inserted through vocal cords under direct vision,  positive ETCO2 and breath sounds checked- equal and bilateral Secured at: 20 cm Tube secured with: Tape Dental Injury: Teeth and Oropharynx as per pre-operative assessment

## 2018-07-15 ENCOUNTER — Encounter (HOSPITAL_COMMUNITY): Payer: Self-pay | Admitting: Orthopedic Surgery

## 2018-07-15 ENCOUNTER — Other Ambulatory Visit: Payer: Self-pay

## 2018-07-15 LAB — GLUCOSE, CAPILLARY
GLUCOSE-CAPILLARY: 120 mg/dL — AB (ref 70–99)
GLUCOSE-CAPILLARY: 104 mg/dL — AB (ref 70–99)
Glucose-Capillary: 127 mg/dL — ABNORMAL HIGH (ref 70–99)
Glucose-Capillary: 86 mg/dL (ref 70–99)

## 2018-07-15 MED ORDER — HYDROXYZINE HCL 25 MG PO TABS
25.0000 mg | ORAL_TABLET | Freq: Three times a day (TID) | ORAL | Status: DC | PRN
  Administered 2018-07-15: 50 mg via ORAL
  Filled 2018-07-15: qty 2

## 2018-07-15 NOTE — Plan of Care (Signed)
  Problem: Pain Management: Goal: Pain level will decrease Outcome: Progressing   

## 2018-07-15 NOTE — Anesthesia Postprocedure Evaluation (Signed)
Anesthesia Post Note  Patient: Heidi Thompson  Procedure(s) Performed: Anterior lateral lumbar fusion L2-4 (N/A ) Posterior spinal fusion  with screws and rods L2-L5 (N/A )     Patient location during evaluation: PACU Anesthesia Type: General Level of consciousness: sedated Pain management: pain level controlled Vital Signs Assessment: post-procedure vital signs reviewed and stable Respiratory status: spontaneous breathing Cardiovascular status: stable Postop Assessment: no apparent nausea or vomiting Anesthetic complications: no    Last Vitals:  Vitals:   07/15/18 0432 07/15/18 0818  BP: 123/60 102/61  Pulse: 73 77  Resp: 18 16  Temp: 36.8 C 36.7 C  SpO2: 95% 100%    Last Pain:  Vitals:   07/15/18 0855  TempSrc:   PainSc: 8    Pain Goal: Patients Stated Pain Goal: 3 (07/14/18 2345)               [place]

## 2018-07-15 NOTE — Progress Notes (Signed)
    Subjective: 1 Day Post-Op Procedure(s) (LRB): Anterior lateral lumbar fusion L2-4 (N/A) Posterior spinal fusion  with screws and rods L2-L5 (N/A) Patient reports pain as 5 on 0-10 scale.   Denies CP or SOB.  Voiding without difficulty. Foley removed a 4am.  Positive flatus.  Ambulating in hallway.  Objective: Vital signs in last 24 hours: Temp:  [97.2 F (36.2 C)-98.4 F (36.9 C)] 98.2 F (36.8 C) (09/12 0432) Pulse Rate:  [65-77] 73 (09/12 0432) Resp:  [10-19] 18 (09/12 0432) BP: (95-162)/(58-89) 123/60 (09/12 0432) SpO2:  [93 %-100 %] 95 % (09/12 0432)  Intake/Output from previous day: 09/11 0701 - 09/12 0700 In: 4050 [I.V.:3500; IV Piggyback:550] Out: 1500 [Urine:1400; Blood:100] Intake/Output this shift: No intake/output data recorded.  Labs: No results for input(s): HGB in the last 72 hours. No results for input(s): WBC, RBC, HCT, PLT in the last 72 hours. No results for input(s): NA, K, CL, CO2, BUN, CREATININE, GLUCOSE, CALCIUM in the last 72 hours. No results for input(s): LABPT, INR in the last 72 hours.  Physical Exam: Neurologically intact ABD soft Sensation intact distally Dorsiflexion/Plantar flexion intact Incision: scant drainage Compartment soft Body mass index is 29.65 kg/m.   Assessment/Plan: 1 Day Post-Op Procedure(s) (LRB): Anterior lateral lumbar fusion L2-4 (N/A) Posterior spinal fusion  with screws and rods L2-L5 (N/A) Advance diet Up with therapy  Considering Dc Friday vs Saturday Added Home health order to chart  [place], Darla Lesches for Dr. Melina Schools [facility] [phone] 07/15/2018, 7:29 AM

## 2018-07-15 NOTE — Evaluation (Signed)
Occupational Therapy Evaluation and Discharge Patient Details Name: Heidi Thompson MRN: [id number] DOB: [birth year] Today's Date: 07/15/2018    History of Present Illness s/p Anterior lateral lumbar fusion L2-L4 PMH: HTN, DM, anxiety, OA, B TKA, cervical surgery.    Clinical Impression   Pt was functioning modified independently, ambulating with a cane prior to admission. Pt currently requires min assist for ambulation and ADL. All education completed. Pt will have assistance of her husband upon discharge. No further OT needs.    Follow Up Recommendations  No OT follow up    Equipment Recommendations  None recommended by OT    Recommendations for Other Services       Precautions / Restrictions Precautions Precautions: Fall;Back Precaution Booklet Issued: Yes (comment) Precaution Comments: educated in back precautions related to ADL and IADL Required Braces or Orthoses: Spinal Brace Spinal Brace: Lumbar corset;Applied in sitting position      Mobility Bed Mobility Overal bed mobility: Modified Independent             General bed mobility comments: increased time, but used log roll technique to return to supine  Transfers Overall transfer level: Needs assistance Equipment used: 1 person hand held assist Transfers: Sit to/from Stand Sit to Stand: Min assist         General transfer comment: very minimal assist to stand from bed    Balance                                           ADL either performed or assessed with clinical judgement   ADL Overall ADL's : Needs assistance/impaired Eating/Feeding: Independent;Sitting   Grooming: Standing;Min guard;Oral care Grooming Details (indicate cue type and reason): knowledgeable in 2 cup method Upper Body Bathing: Minimal assistance;Sitting Upper Body Bathing Details (indicate cue type and reason): recommended long handled bath sponge for back Lower Body Bathing: Minimal assistance;Sit to/from  stand Lower Body Bathing Details (indicate cue type and reason): recommended use of long handled bath sponge and reacher Upper Body Dressing : Set up;Sitting   Lower Body Dressing: Minimal assistance;Sit to/from stand Lower Body Dressing Details (indicate cue type and reason): educated in use of reacher for LB dressing, pt will likely rely on her husband until she can cross her foot over opposite knee Toilet Transfer: Minimal assistance;Ambulation(hand held)   Toileting- Water quality scientist and Hygiene: Min guard;Sit to/from stand Toileting - Clothing Manipulation Details (indicate cue type and reason): educated to avoid twisting with pericare     Functional mobility during ADLs: Minimal assistance(hand held) General ADL Comments: Instructed in activities to avoid during housekeeping.     Vision Patient Visual Report: No change from baseline       Perception     Praxis      Pertinent Vitals/Pain Pain Assessment: 0-10 Pain Score: 8  Pain Location: back Pain Descriptors / Indicators: Aching Pain Intervention(s): Monitored during session;Repositioned;Patient requesting pain meds-RN notified     Hand Dominance Right   Extremity/Trunk Assessment Upper Extremity Assessment Upper Extremity Assessment: Overall WFL for tasks assessed(arthritic changes in hands)   Lower Extremity Assessment Lower Extremity Assessment: Defer to PT evaluation   Cervical / Trunk Assessment Cervical / Trunk Assessment: Other exceptions(s/p lumbar fusion)   Communication Communication Communication: No difficulties   Cognition Arousal/Alertness: Awake/alert Behavior During Therapy: Anxious Overall Cognitive Status: Within Functional Limits for tasks assessed  General Comments       Exercises     Shoulder Instructions      Home Living Family/patient expects to be discharged to:: Private residence Living Arrangements: Spouse/significant  other Available Help at Discharge: Family;Available 24 hours/day Type of Home: House Home Access: Stairs to enter [employer] of Steps: 2 Entrance Stairs-Rails: None Home Layout: One level     Bathroom Shower/Tub: Occupational psychologist: Handicapped height     Home Equipment: Environmental consultant - 2 wheels;Adaptive equipment;Cane - single point Adaptive Equipment: Reacher        Prior Functioning/Environment Level of Independence: Independent with assistive device(s)        Comments: walking with a cane prior to admission        OT Problem List: Pain      OT Treatment/Interventions:      OT Goals(Current goals can be found in the care plan section) Acute Rehab OT Goals Patient Stated Goal: to return home with husband's assist  OT Frequency:     Barriers to D/C:            Co-evaluation              AM-PAC PT "6 Clicks" Daily Activity     Outcome Measure Help from another person eating meals?: None Help from another person taking care of personal grooming?: A Little Help from another person toileting, which includes using toliet, bedpan, or urinal?: A Little Help from another person bathing (including washing, rinsing, drying)?: A Little Help from another person to put on and taking off regular upper body clothing?: None Help from another person to put on and taking off regular lower body clothing?: A Little 6 Click Score: 20   End of Session Equipment Utilized During Treatment: Gait belt;Back brace Nurse Communication: Patient requests pain meds  Activity Tolerance: Patient tolerated treatment well Patient left: in bed;with call bell/phone within reach;with family/visitor present  OT Visit Diagnosis: Pain;Other abnormalities of gait and mobility (R26.89)                Time: 5188-4166 OT Time Calculation (min): 24 min Charges:  OT General Charges $OT Visit: 1 Visit OT Evaluation $OT Eval Moderate Complexity: 1 Mod OT Treatments $Self  Care/Home Management : 8-22 mins  Nestor Lewandowsky, OTR/L Acute Rehabilitation Services Pager: [phone] Office: [phone]  Heidi Thompson 07/15/2018, 8:52 AM

## 2018-07-15 NOTE — Care Management Note (Addendum)
Case Management Note  Patient Details  Name: Heidi Thompson MRN: [id number] Date of Birth: Sep 05, 1946  Subjective/Objective:   XLIF                  Action/Plan: Spoke to pt, husband and dtr, Sherrie at bedside. Pt has RW at home. Offered choice for [facility]. Dtr requested Estate manager/land agent [facility]) [place]. [place] and requested referral faxed to # [phone]. Faxed orders, facesheet, and H&P/op note to [facility]. Will fax dc summary once available.   Expected Discharge Date:             Expected Discharge Plan:  [place]  In-House Referral:  NA  Discharge planning Services  CM Consult  Post Acute Care Choice:  Home Health Choice offered to:  Patient, Adult Children  DME Arranged:  N/A DME Agency:  NA  HH Arranged:  PT, OT [place] Agency:  [facility]  Status of Service:  Completed, signed off  If discussed at [place] of Stay Meetings, dates discussed:    Additional Comments:  Erenest Rasher, RN 07/15/2018, 2:21 PM

## 2018-07-15 NOTE — Evaluation (Signed)
Physical Therapy Evaluation Patient Details Name: Heidi Thompson MRN: [id number] DOB: Oct 19, 1946 Today's Date: 07/15/2018   History of Present Illness  s/p Anterior lateral lumbar fusion L2-L4 PMH: HTN, DM, anxiety, OA, B TKA, cervical surgery.   Clinical Impression  Patient is s/p above surgery resulting in the deficits listed below (see PT Problem List). Prior to surgery, patient ambulating with a cane. Currently patient requiring up to minimal assistance for transfers and min guard assist using cane for walking 200 feet. Overall, patient presenting with decreased functional mobility secondary to back pain, fear of movement, and balance deficits. Will have husband assist on discharge. Patient will benefit from skilled PT to increase their independence and safety with mobility (while adhering to their precautions) to allow discharge to the venue listed below.     Follow Up Recommendations No PT follow up;Supervision for mobility/OOB    Equipment Recommendations  None recommended by PT    Recommendations for Other Services       Precautions / Restrictions Precautions Precautions: Fall;Back Precaution Booklet Issued: Yes (comment) Precaution Comments: able to recall 3/3 precautions Required Braces or Orthoses: Spinal Brace Spinal Brace: Lumbar corset;Applied in sitting position Restrictions Weight Bearing Restrictions: No      Mobility  Bed Mobility Overal bed mobility: Needs Assistance Bed Mobility: Rolling;Sidelying to Sit Rolling: Supervision Sidelying to sit: Min assist       General bed mobility comments: light min assist for trunk elevation. significantly increased time and effort  Transfers Overall transfer level: Needs assistance Equipment used: 1 person hand held assist Transfers: Sit to/from Stand Sit to Stand: Min assist         General transfer comment: cues to bring nose over toes (within precautions)  Ambulation/Gait Ambulation/Gait assistance: Min  guard Gait Distance (Feet): 200 Feet Assistive device: None;Straight cane Gait Pattern/deviations: Step-through pattern;Decreased stride length;Decreased step length - left Gait velocity: decreased Gait velocity interpretation: <1.31 ft/sec, indicative of household ambulator General Gait Details: patient with slow and guarded gait. slightly improved steadiness and gait speed using cane compared to no device. decreased right arm swing  Stairs            Wheelchair Mobility    Modified Rankin (Stroke Patients Only)       Balance Overall balance assessment: Mild deficits observed, not formally tested                                           Pertinent Vitals/Pain Pain Assessment: 0-10 Pain Score: 8  Pain Location: back Pain Descriptors / Indicators: Aching Pain Intervention(s): Monitored during session;Premedicated before session    Home Living Family/patient expects to be discharged to:: Private residence Living Arrangements: Spouse/significant other Available Help at Discharge: Family;Available 24 hours/day Type of Home: House Home Access: Stairs to enter Entrance Stairs-Rails: None Entrance Stairs-Number of Steps: 2 Home Layout: One level Home Equipment: Walker - 2 wheels;Adaptive equipment;Cane - single point      Prior Function Level of Independence: Independent with assistive device(s)         Comments: walking with a cane prior to admission     Hand Dominance   Dominant Hand: Right    Extremity/Trunk Assessment   Upper Extremity Assessment Upper Extremity Assessment: Overall WFL for tasks assessed    Lower Extremity Assessment Lower Extremity Assessment: RLE deficits/detail;LLE deficits/detail RLE Deficits / Details: Grossly 4/5, previous TKA LLE Deficits /  Details: Grossly 4/5 except hip flexion 3+/5, previous TKA    Cervical / Trunk Assessment Cervical / Trunk Assessment: Other exceptions(s/p lumbar fusion)   Communication   Communication: No difficulties  Cognition Arousal/Alertness: Awake/alert Behavior During Therapy: Anxious Overall Cognitive Status: Within Functional Limits for tasks assessed                                        General Comments      Exercises     Assessment/Plan    PT Assessment Patient needs continued PT services  PT Problem List Decreased strength;Decreased activity tolerance;Decreased balance;Decreased mobility;Pain       PT Treatment Interventions DME instruction;Gait training;Stair training;Functional mobility training;Therapeutic activities;Therapeutic exercise;Balance training;Patient/family education    PT Goals (Current goals can be found in the Care Plan section)  Acute Rehab PT Goals Patient Stated Goal: to return home with husband's assist PT Goal Formulation: With patient Time For Goal Achievement: 07/17/18 Potential to Achieve Goals: Good    Frequency Min 5X/week   Barriers to discharge        Co-evaluation               AM-PAC PT "6 Clicks" Daily Activity  Outcome Measure Difficulty turning over in bed (including adjusting bedclothes, sheets and blankets)?: A Lot Difficulty moving from lying on back to sitting on the side of the bed? : A Lot Difficulty sitting down on and standing up from a chair with arms (e.g., wheelchair, bedside commode, etc,.)?: A Lot Help needed moving to and from a bed to chair (including a wheelchair)?: A Little Help needed walking in hospital room?: A Little Help needed climbing 3-5 steps with a railing? : A Little 6 Click Score: 15    End of Session Equipment Utilized During Treatment: Gait belt;Back brace Activity Tolerance: Patient tolerated treatment well Patient left: in bed;with call bell/phone within reach;with family/visitor present Nurse Communication: Mobility status PT Visit Diagnosis: Unsteadiness on feet (R26.81);Other abnormalities of gait and mobility  (R26.89);Difficulty in walking, not elsewhere classified (R26.2);Pain Pain - part of body: (back)    Time: 6010-9323 PT Time Calculation (min) (ACUTE ONLY): 25 min   Charges:   PT Evaluation $PT Eval Moderate Complexity: 1 Mod PT Treatments $Gait Training: 8-22 mins        Ellamae Sia, PT, DPT Acute Rehabilitation Services Pager [phone] Office [phone]   Willy Eddy 07/15/2018, 11:00 AM

## 2018-07-16 LAB — GLUCOSE, CAPILLARY: Glucose-Capillary: 124 mg/dL — ABNORMAL HIGH (ref 70–99)

## 2018-07-16 NOTE — Progress Notes (Signed)
Physical Therapy Treatment Patient Details Name: Heidi Thompson MRN: [id number] DOB: 07/10/1946 Today's Date: 07/16/2018    History of Present Illness s/p Anterior lateral lumbar fusion L2-L4 PMH: HTN, DM, anxiety, OA, B TKA, cervical surgery.     PT Comments    Session focused on stair training and reviewing spinal precautions. Patient able to negotiate 4 steps with use of right railing and cane to simulate home set up. Husband present and instructed on guarding technique; both patient and patient husband verbalized understanding. Patient preferring use of walker compared to cane for ambulation; currently ambulating with supervision with slow gait speed. Pt and pt husband with no further questions/concerns. No further acute PT needs. PT signing off.     Follow Up Recommendations  No PT follow up;Supervision for mobility/OOB     Equipment Recommendations  None recommended by PT    Recommendations for Other Services       Precautions / Restrictions Precautions Precautions: Fall;Back Precaution Booklet Issued: Yes (comment) Precaution Comments: able to recall 2/3 precautions Required Braces or Orthoses: Spinal Brace Spinal Brace: Lumbar corset;Applied in sitting position Restrictions Weight Bearing Restrictions: No    Mobility  Bed Mobility Overal bed mobility: Modified Independent Bed Mobility: Rolling;Sidelying to Sit Rolling: Modified independent (Device/Increase time) Sidelying to sit: Modified independent (Device/Increase time)       General bed mobility comments: increased time and effort with heavy use of bedrail  Transfers Overall transfer level: Needs assistance Equipment used: None Transfers: Sit to/from Stand Sit to Stand: Min guard         General transfer comment: cues for hand placement. significantly increased time and effort  Ambulation/Gait Ambulation/Gait assistance: Supervision   Assistive device: Straight cane;Rolling walker (2 wheeled) Gait  Pattern/deviations: Step-through pattern;Decreased stride length;Decreased step length - left Gait velocity: decreased Gait velocity interpretation: <1.31 ft/sec, indicative of household ambulator General Gait Details: Patient preferring walker compared to cane for bilateral hand support. continues with slow gait but no evidence of imbalance   Stairs Stairs: Yes Stairs assistance: Min guard Stair Management: One rail Right;With cane Number of Stairs: 4 General stair comments: cues for sequencing. husband present to review technique   Wheelchair Mobility    Modified Rankin (Stroke Patients Only)       Balance Overall balance assessment: Mild deficits observed, not formally tested                                          Cognition Arousal/Alertness: Awake/alert Behavior During Therapy: WFL for tasks assessed/performed Overall Cognitive Status: Within Functional Limits for tasks assessed                                        Exercises      General Comments        Pertinent Vitals/Pain Pain Assessment: Faces Faces Pain Scale: Hurts a little bit Pain Location: back Pain Descriptors / Indicators: Aching Pain Intervention(s): Monitored during session    Home Living                      Prior Function            PT Goals (current goals can now be found in the care plan section) Acute Rehab PT Goals Patient Stated Goal: to return home  with husband's assist PT Goal Formulation: With patient Time For Goal Achievement: 07/17/18 Potential to Achieve Goals: Good Progress towards PT goals: Goals met/education completed, patient discharged from PT    Frequency    Min 5X/week      PT Plan Other (comment)(d/c PT)    Co-evaluation              AM-PAC PT "6 Clicks" Daily Activity  Outcome Measure  Difficulty turning over in bed (including adjusting bedclothes, sheets and blankets)?: A Lot Difficulty moving from  lying on back to sitting on the side of the bed? : A Lot Difficulty sitting down on and standing up from a chair with arms (e.g., wheelchair, bedside commode, etc,.)?: A Lot Help needed moving to and from a bed to chair (including a wheelchair)?: A Little Help needed walking in hospital room?: A Little Help needed climbing 3-5 steps with a railing? : A Little 6 Click Score: 15    End of Session Equipment Utilized During Treatment: Gait belt;Back brace Activity Tolerance: Patient tolerated treatment well Patient left: in bed;with call bell/phone within reach;with family/visitor present Nurse Communication: Mobility status PT Visit Diagnosis: Unsteadiness on feet (R26.81);Other abnormalities of gait and mobility (R26.89);Difficulty in walking, not elsewhere classified (R26.2);Pain Pain - part of body: (back)     Time: 5726-2035 PT Time Calculation (min) (ACUTE ONLY): 21 min  Charges:  $Therapeutic Activity: 8-22 mins                    Heidi Thompson, PT, DPT Acute Rehabilitation Services Pager [phone] Office [phone]    Heidi Thompson 07/16/2018, 10:59 AM

## 2018-07-16 NOTE — Progress Notes (Signed)
    Subjective: Procedure(s) (LRB): Anterior lateral lumbar fusion L2-4 (N/A) Posterior spinal fusion  with screws and rods L2-L5 (N/A) 2 Days Post-Op  Patient reports pain as 3 on 0-10 scale.  Reports decreased leg pain reports incisional back pain   Positive void Negative bowel movement Positive flatus Negative chest pain or shortness of breath  Objective: Vital signs in last 24 hours: Temp:  [98 F (36.7 C)-98.3 F (36.8 C)] 98.3 F (36.8 C) (09/13 0737) Pulse Rate:  [67-89] 89 (09/13 0737) Resp:  [16-18] 16 (09/13 0737) BP: (102-140)/(45-62) 131/55 (09/13 0737) SpO2:  [93 %-100 %] 96 % (09/13 0737)  Intake/Output from previous day: 09/12 0701 - 09/13 0700 In: 240 [P.O.:240] Out: 320 [Urine:320]  Labs: No results for input(s): WBC, RBC, HCT, PLT in the last 72 hours. No results for input(s): NA, K, CL, CO2, BUN, CREATININE, GLUCOSE, CALCIUM in the last 72 hours. No results for input(s): LABPT, INR in the last 72 hours.  Physical Exam: Neurologically intact ABD soft Intact pulses distally Dorsiflexion/Plantar flexion intact Incision: dressing C/D/I and no drainage Compartment soft Body mass index is 29.65 kg/m.  No hip flexor weakness noted  Assessment/Plan: Patient stable  xrays n/a Continue mobilization with physical therapy Continue care  Advance diet Up with therapy  Plan on d/c to home today Patient requesting transport as she lives over 1 hr away - will look into arrangements Doing well from PT standpoint  Home health to be aranged  Heidi Schools, MD [facility] [phone]

## 2018-07-21 NOTE — Discharge Summary (Signed)
Physician Discharge Summary  Patient ID: Heidi Thompson MRN: [id number] DOB/AGE: [birth year] 72 y.o.  Admit date: 07/14/2018 Discharge date: 07/16/18  Admission Diagnoses:  Lumbar Degenerative Disc Disease  Discharge Diagnoses:  Active Problems:   S/P lumbar fusion   Past Medical History:  Diagnosis Date  . Anxiety   . Arthritis    spondylotic radiculopathy, neck, back, shoulder   . Cancer ([place]) 1978   melanoma- on R leg, removed  . Diabetes mellitus without complication ([place])   . Heart murmur   . History of kidney stones    on imaging, saw 2 stones in kidney & pt. reports that she thinks she has passed one on her own at one time.   . Hypertension   . PONV (postoperative nausea and vomiting)     Surgeries: Procedure(s): Anterior lateral lumbar fusion L2-4 Posterior spinal fusion  with screws and rods L2-L5 on 07/14/2018   Consultants (if any):   Discharged Condition: Improved  Hospital Course: Klohe Lovering is an 72 y.o. female who was admitted 07/14/2018 with a diagnosis of Lumbar degenerative disc disease and went to the operating room on 07/14/2018 and underwent the above named procedures.  Post op day 1 pt reported moderate pain. Foley was removed.  Pt was voiding w/o difficulty. Pt ambulated with PT/OT. Post op day 2 pt pain continued to improve on oral medications. Pt ambulating in hallway.  PT met with pt to work on stairs.  Pt cleared for DC with home health.    She was given perioperative antibiotics:  Anti-infectives (From admission, onward)   Start     Dose/Rate Route Frequency Ordered Stop   07/14/18 2015  ceFAZolin (ANCEF) IVPB 2g/100 mL premix     2 g 200 mL/hr over 30 Minutes Intravenous Every 8 hours 07/14/18 2005 07/15/18 1214   07/14/18 0630  ceFAZolin (ANCEF) IVPB 2g/100 mL premix     2 g 200 mL/hr over 30 Minutes Intravenous 30 min pre-op 07/14/18 0630 07/14/18 0933    .  She was given sequential compression devices, early ambulation, and TED for DVT  prophylaxis.  She benefited maximally from the hospital stay and there were no complications.    Recent vital signs:  Vitals:   07/16/18 0402 07/16/18 0737  BP: 137/61 (!) 131/55  Pulse: 75 89  Resp: 18 16  Temp: 98 F (36.7 C) 98.3 F (36.8 C)  SpO2: 93% 96%    Recent laboratory studies:  Lab Results  Component Value Date   HGB 13.1 07/09/2018   HGB 12.1 03/13/2017   Lab Results  Component Value Date   WBC 7.3 07/09/2018   PLT 279 07/09/2018   No results found for: INR Lab Results  Component Value Date   NA 141 07/09/2018   K 3.4 (L) 07/09/2018   CL 103 07/09/2018   CO2 29 07/09/2018   BUN 7 (L) 07/09/2018   CREATININE 0.99 07/09/2018   GLUCOSE 108 (H) 07/09/2018    Discharge Medications:   Allergies as of 07/16/2018   No Known Allergies     Medication List    STOP taking these medications   naproxen sodium 220 MG tablet Commonly known as:  ALEVE     TAKE these medications   atorvastatin 20 MG tablet Commonly known as:  LIPITOR Take 20 mg by mouth every evening.   carvedilol 25 MG tablet Commonly known as:  COREG Take 25 mg by mouth 2 (two) times daily.   chlorthalidone 25 MG tablet Commonly known  as:  HYGROTON Take 25 mg by mouth daily before breakfast.   escitalopram 20 MG tablet Commonly known as:  LEXAPRO Take 20 mg by mouth daily before breakfast.   ferrous sulfate 325 (65 FE) MG tablet Take 325 mg by mouth daily with breakfast.   levothyroxine 50 MCG tablet Commonly known as:  SYNTHROID, LEVOTHROID Take 50 mcg by mouth daily before breakfast. 2 hours after breakfast   LORazepam 1 MG tablet Commonly known as:  ATIVAN Take 1 mg by mouth 2 (two) times daily.   metFORMIN 500 MG tablet Commonly known as:  GLUCOPHAGE Take 500 mg by mouth every evening.   methocarbamol 500 MG tablet Commonly known as:  ROBAXIN Take 1 tablet (500 mg total) by mouth 3 (three) times daily.   omeprazole 20 MG tablet Commonly known as:  PRILOSEC  OTC Take 20 mg by mouth daily before breakfast.   ondansetron 4 MG disintegrating tablet Commonly known as:  ZOFRAN-ODT Take 1 tablet (4 mg total) by mouth every 8 (eight) hours as needed for nausea or vomiting.   potassium chloride SA 20 MEQ tablet Commonly known as:  K-DUR,KLOR-CON Take 30 mEq by mouth daily with lunch.   SYSTANE OP Place 1 drop into both eyes 2 (two) times daily.   Vitamin D3 2000 units Tabs Take 2,000 Units by mouth daily.     ASK your doctor about these medications   oxyCODONE-acetaminophen 10-325 MG tablet Commonly known as:  PERCOCET Take 1 tablet by mouth every 4 (four) hours as needed for up to 5 days for pain. Ask about: Should I take this medication?       Diagnostic Studies: Dg Lumbar Spine Complete  Result Date: 07/14/2018 CLINICAL DATA:  L2-3 and L3-4 XLIF and L2 through L5 POSTERIOR fusion. EXAM: Operative LUMBAR SPINE - 2 VIEW COMPARISON:  None. FINDINGS: Five spot images from the C-arm fluoroscopic device, AP and LATERAL views of the lumbar spine, are submitted for interpretation postoperatively. Placement of disc prostheses at L2-3 and L3-4, appropriately positioned in the disc spaces. Posterolateral fusion with hardware from L2 through L4 without complicating features. Narrowed L4-5 disc space. The radiologic technologist documented 8 minutes 39 seconds of fluoroscopy time. IMPRESSION: L2-3 and L3-4 XLIF and L2 through L5 POSTERIOR fusion. Electronically Signed   By: Evangeline Dakin M.D.   On: 07/14/2018 15:17   Dg C-arm 1-60 Min  Result Date: 07/14/2018 CLINICAL DATA:  L2-3 and L3-4 XLIF and L2 through L5 POSTERIOR fusion. EXAM: Operative LUMBAR SPINE - 2 VIEW COMPARISON:  None. FINDINGS: Five spot images from the C-arm fluoroscopic device, AP and LATERAL views of the lumbar spine, are submitted for interpretation postoperatively. Placement of disc prostheses at L2-3 and L3-4, appropriately positioned in the disc spaces. Posterolateral fusion  with hardware from L2 through L4 without complicating features. Narrowed L4-5 disc space. The radiologic technologist documented 8 minutes 39 seconds of fluoroscopy time. IMPRESSION: L2-3 and L3-4 XLIF and L2 through L5 POSTERIOR fusion. Electronically Signed   By: Evangeline Dakin M.D.   On: 07/14/2018 15:17   Dg C-arm 1-60 Min  Result Date: 07/14/2018 CLINICAL DATA:  L2-3 and L3-4 XLIF and L2 through L5 POSTERIOR fusion. EXAM: Operative LUMBAR SPINE - 2 VIEW COMPARISON:  None. FINDINGS: Five spot images from the C-arm fluoroscopic device, AP and LATERAL views of the lumbar spine, are submitted for interpretation postoperatively. Placement of disc prostheses at L2-3 and L3-4, appropriately positioned in the disc spaces. Posterolateral fusion with hardware from L2 through  L4 without complicating features. Narrowed L4-5 disc space. The radiologic technologist documented 8 minutes 39 seconds of fluoroscopy time. IMPRESSION: L2-3 and L3-4 XLIF and L2 through L5 POSTERIOR fusion. Electronically Signed   By: Evangeline Dakin M.D.   On: 07/14/2018 15:17   Dg C-arm 1-60 Min  Result Date: 07/14/2018 CLINICAL DATA:  L2-3 and L3-4 XLIF and L2 through L5 POSTERIOR fusion. EXAM: Operative LUMBAR SPINE - 2 VIEW COMPARISON:  None. FINDINGS: Five spot images from the C-arm fluoroscopic device, AP and LATERAL views of the lumbar spine, are submitted for interpretation postoperatively. Placement of disc prostheses at L2-3 and L3-4, appropriately positioned in the disc spaces. Posterolateral fusion with hardware from L2 through L4 without complicating features. Narrowed L4-5 disc space. The radiologic technologist documented 8 minutes 39 seconds of fluoroscopy time. IMPRESSION: L2-3 and L3-4 XLIF and L2 through L5 POSTERIOR fusion. Electronically Signed   By: Evangeline Dakin M.D.   On: 07/14/2018 15:17    Disposition:  Pt will present to clinic in 2 weeks Post op meds provided  Discharge Instructions    Incentive  spirometry RT   Complete by:  As directed       Follow-up Information    Melina Schools, MD Follow up in 2 week(s).   Specialty:  Orthopedic Surgery Contact information: [address] [place] [address] [place] [address] [phone]        [facility] Follow up.   Why:  Home Health Physical Therapy and Occupational Therapy-agency will call to arrange initial appointment Contact information: [phone]           Signed: Valinda Hoar 07/21/2018, 8:25 AM

## 2019-12-19 ENCOUNTER — Encounter: Payer: Self-pay | Admitting: Psychology

## 2020-01-26 ENCOUNTER — Encounter: Payer: Self-pay | Admitting: Psychology

## 2020-01-26 ENCOUNTER — Encounter: Payer: Medicare Other | Attending: Psychology | Admitting: Psychology

## 2020-01-26 ENCOUNTER — Other Ambulatory Visit: Payer: Self-pay

## 2020-01-26 DIAGNOSIS — Z981 Arthrodesis status: Secondary | ICD-10-CM | POA: Insufficient documentation

## 2020-01-26 DIAGNOSIS — M961 Postlaminectomy syndrome, not elsewhere classified: Secondary | ICD-10-CM | POA: Diagnosis present

## 2020-01-26 DIAGNOSIS — E119 Type 2 diabetes mellitus without complications: Secondary | ICD-10-CM | POA: Insufficient documentation

## 2020-01-26 DIAGNOSIS — F418 Other specified anxiety disorders: Secondary | ICD-10-CM | POA: Diagnosis present

## 2020-01-26 DIAGNOSIS — I10 Essential (primary) hypertension: Secondary | ICD-10-CM | POA: Insufficient documentation

## 2020-01-26 DIAGNOSIS — G894 Chronic pain syndrome: Secondary | ICD-10-CM | POA: Diagnosis present

## 2020-01-26 NOTE — Progress Notes (Addendum)
Neuropsychological Consultation   Patient:   Heidi Thompson   DOB:   1946/10/14  MR Number:  [id number]  Location:  [facility] [place], [place] [id number] [place] [address] Dept: [phone]           Date of Service:   01/26/2020  Start Time:   10 AM End Time:   11 AM  Provider/Observer:  Ilean Skill, Psy.D.       Clinical Neuropsychologist       Billing Code/Service: Psychological diagnostic interview  Chief Complaint:    Heidi Thompson is a 74 year old female referred by Suella Broad, MD for psychological evaluation as part of the standard work-up procedures for consideration of a spinal cord stimulator trialing and possible implantation. The patient has a history of lumbar degenerative disc disease with two prior surgeries including anterior lateral lumbar fusion L2-4 and posterior spinal fusion with screws and rods L2-L5. She has continued to have issues with lumbar post laminectomy pain and chronic pain which is continuing to have a significant negative impact on her life functioning. The patient also describes the development of some depression and anxiety symptoms that developed with residual pain with no prior history of depression or anxiety.  Reason for Service:  Heidi Thompson is a 74 year old female referred for psychological evaluation as part of the standard work-up procedures for consideration of a spinal cord stimulator trialing and possible implantation. The patient has a history of lumbar degenerative disc disease with two prior surgeries including anterior lateral lumbar fusion L2-4 and posterior spinal fusion with screws and rods L2-L5. She has continued to have issues with lumbar post laminectomy pain and chronic pain which is continuing to have a significant negative impact on her life functioning. The patient also describes the development of some  depression and anxiety symptoms that developed with residual pain with no prior history of depression or anxiety.  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 The patient has diabetes without complication, arthritis, heart murmur, history of kidney stones, hypertension. Past surgical history can be found in the patient's medical records and along with the two prior back surgery she is also had orthoscopic shoulder surgery, hysterectomy, bilateral knee replacement, thumb surgery and thyroidectomy.  The patient reports that she is struggled with pain for some time and that she has arthritis throughout her back as well as other joints in her body. The patient reports that while her back surgeries were successful structurally she continues to have significant pain which is the reason for consideration of spinal cord stimulator surgery. The patient reports that she has been well informed about the potential benefits risk of this procedure. The patient reports that she has ongoing difficulties bending, going on long walks and lifting anything of weight.  The patient reports that she has a normal sleep pattern and rarely her limited times of difficulty going to sleep but uses good strategies for sleep hygiene on a regular basis. The patient describes a good appetite and memory/cognition to be normal for age. During the clinical interview there did not appear to be any cognitive or mental status issues noted.  Reliability of Information: Information is derived from 1 hour face-to-face clinical interview with the patient and her husband as well as review of available medical records.  Behavioral Observation: Heidi Thompson  presents as a 74 y.o.-year-old-year-old Right Caucasian  Female who appeared her stated age. her dress was Appropriate and she was Well Groomed and her manners were Appropriate to the situation.  her participation was indicative of  Appropriate and Attentive behaviors.  There were physical disabilities noted.  she displayed an appropriate level of cooperation and motivation.     Interactions:    Active Appropriate and Attentive  Attention:   within normal limits and attention span and concentration were age appropriate  Memory:   within normal limits; recent and remote memory intact  Visuo-spatial:  not examined  Speech (Volume):  normal  Speech:   normal; normal  Thought Process:  Coherent and Relevant  Though Content:  WNL; not suicidal and not homicidal  Orientation:   person, place, time/date and situation  Judgment:   Good  Planning:   Good  Affect:    Appropriate  Mood:    Dysphoric  Insight:   Good  Intelligence:   normal  Marital Status/Living: The patient was born and raised in [place] with three siblings. Normal childhood illnesses of measles and chickenpox are noted and her mother's pregnancy with her and birth were without complication. No significant developmental or childhood issues are noted. The patient currently lives with her husband and adult son who is legally blind. Patient has been married to her husband for 10 years and their marriage is described to be a positive healthy marriage. The patient has two adult children. She has a 69 year old daughter that is doing well in life and a 92 year old son that is legally blind and has hearing impairments from birth.  Current Employment: The patient is retired.  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  She left this job due to retirement.  Hobbies and interest have included crocheting, working jigsaw puzzles, word search puzzles and raising flowers and gardening.  Substance Use:  No concerns of substance abuse are reported.    Education:   HS Graduate  Medical History:   Past Medical History:  Diagnosis Date  . Anxiety   . Arthritis    spondylotic  radiculopathy, neck, back, shoulder   . Cancer ([place]) 1978   melanoma- on R leg, removed  . Diabetes mellitus without complication ([place] [facility])   . Heart murmur   . History of kidney stones    on imaging, saw 2 stones in kidney & pt. reports that she thinks she has passed one on her own at one time.   . Hypertension   . PONV (postoperative nausea and vomiting)        Abuse/Trauma History: There are no indications of any life traumas or history of abuse.  Psychiatric History:  The patient does have a history of anxiety with some associated with depressive type symptoms that are directly related to significant pain symptoms.  Family Med/Psych History: History reviewed. No pertinent family history.  Impression/DX:  Heidi Thompson is a 74 year old female referred for psychological evaluation as part of the standard work-up procedures for consideration of a spinal cord stimulator trialing and possible implantation. The patient has a history of lumbar degenerative disc disease with two prior surgeries including anterior lateral lumbar fusion L2-4 and posterior spinal fusion with screws and rods L2-L5. She has continued to have issues with lumbar post laminectomy pain and chronic pain which is continuing to have a significant negative impact on her life functioning. The patient also describes the development of some depression and anxiety symptoms  that developed with residual pain with no prior history of depression or anxiety.  Disposition/Plan:  We have set the patient up for formal objective psychological testing that will include the [place] multiphasic personality inventory as well as the pain patient profile.  The patient will complete these profiles and once they have been scored we will produce a formal report for her referring physician as part of the work-up for a evaluation for spinal cord stimulator trial and possible implantation.  While we will not schedule a specific feedback for the patient I will  make sure her report is available to the patient in my chart for her review and if there are any questions I will be available to answer those questions.  Diagnosis:    Chronic pain syndrome  Lumbar post-laminectomy syndrome  Depression with anxiety         Electronically Signed   _______________________ Ilean Skill, Psy.D.

## 2020-02-06 ENCOUNTER — Ambulatory Visit: Payer: Medicare Other | Admitting: Psychology

## 2020-02-09 ENCOUNTER — Encounter: Payer: Medicare Other | Attending: Psychology | Admitting: Psychology

## 2020-02-09 ENCOUNTER — Other Ambulatory Visit: Payer: Self-pay

## 2020-02-09 ENCOUNTER — Encounter: Payer: Self-pay | Admitting: Psychology

## 2020-02-09 DIAGNOSIS — F418 Other specified anxiety disorders: Secondary | ICD-10-CM | POA: Insufficient documentation

## 2020-02-09 DIAGNOSIS — Z85820 Personal history of malignant melanoma of skin: Secondary | ICD-10-CM | POA: Insufficient documentation

## 2020-02-09 DIAGNOSIS — G894 Chronic pain syndrome: Secondary | ICD-10-CM

## 2020-02-09 DIAGNOSIS — Z981 Arthrodesis status: Secondary | ICD-10-CM | POA: Diagnosis not present

## 2020-02-09 DIAGNOSIS — I10 Essential (primary) hypertension: Secondary | ICD-10-CM | POA: Insufficient documentation

## 2020-02-09 DIAGNOSIS — M1990 Unspecified osteoarthritis, unspecified site: Secondary | ICD-10-CM | POA: Diagnosis not present

## 2020-02-09 DIAGNOSIS — Z96653 Presence of artificial knee joint, bilateral: Secondary | ICD-10-CM | POA: Diagnosis not present

## 2020-02-09 DIAGNOSIS — M961 Postlaminectomy syndrome, not elsewhere classified: Secondary | ICD-10-CM | POA: Diagnosis present

## 2020-02-09 DIAGNOSIS — E119 Type 2 diabetes mellitus without complications: Secondary | ICD-10-CM | POA: Diagnosis not present

## 2020-02-09 NOTE — Progress Notes (Signed)
Psychological Evaluation    Patient:  Heidi Thompson   DOB: Aug 16, 1946  MR Number: [id number]  Location: [facility] [place], [place] [id number] [place] [address] Dept: [phone]  Start: 9 AM End: 10 AM  Provider/Observer:     Edgardo Roys PsyD  Chief Complaint:      Chief Complaint  Patient presents with  . Pain  . Anxiety  . Depression    Reason For Service:     Delva Llera is a 74 year old female referred for psychological evaluation as part of the standard work-up procedures for consideration of a spinal cord stimulator trialing and possible implantation. The patient has a history of lumbar degenerative disc disease with two prior surgeries including anterior lateral lumbar fusion L2-4 and posterior spinal fusion with screws and rods L2-L5. She has continued to have issues with lumbar post laminectomy pain and chronic pain which is continuing to have a significant negative impact on her life functioning. The patient also describes the development of some depression and anxiety symptoms that developed with residual pain with no prior history of depression or anxiety.  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 The patient has diabetes without complication, arthritis, heart murmur, history of kidney stones, hypertension. Past surgical history can be found in the patient's medical records and along with the two prior back surgery she is also had orthoscopic shoulder surgery, hysterectomy, bilateral knee replacement, thumb surgery and thyroidectomy.  The patient reports that she is struggled with pain for some time and that she has arthritis throughout her back as well as other joints in her body. The patient reports that while her back surgeries were successful structurally, she continues to  have significant pain which is the reason for consideration of spinal cord stimulator surgery. The patient reports that she has been well informed about the potential benefits risk of this procedure. The patient reports that she has ongoing difficulties bending, going on long walks and lifting anything of weight.  The patient reports that she has a normal sleep pattern and rarely her limited times of difficulty going to sleep but uses good strategies for sleep hygiene on a regular basis. The patient describes a good appetite and memory/cognition to be normal for age. During the clinical interview there did not appear to be any cognitive or mental status issues noted.  Testing Administered:  The patient completed objective testing consisting of the [place] multiphasic personality inventory-2 as well as the pain patient profile inventory (P3).  Participation Level:   Active  Participation Quality:  Appropriate and Attentive      Behavioral Observation:  Well Groomed, Alert, and Appropriate.   Summary of Results:   On both the MMPI-2 and the P3 inventories, the patient produced valid profiles without any indication of attempts to exaggerate or minimize her current symptomatology and these do appear to be fair and valid assessments of her current status.  Analyzing the patient's resulting clinical scales of the MMPI-2, the patient does have some mild to moderate elevations in both symptoms of anxiety and depressive symptomatology, which are consistent with the patient's subjective reports.  There are also elevations in the patient's measures of both vague and specific somatic complaints, which are also consistent with subjective reports and consistent with chronic pain symptoms as well.  There are no indications of any significant psychiatric  illness and no indications of hallucinations or delusions per se.  The patient's content and supplemental scales do not suggest particular vulnerabilities toward  substance abuse and no indications of significant prior or chronic posttraumatic stress disorder or various coping impairments.  The patient denies any significant psychosocial variables that are having any degree of significant negative impact on her coping and managing of her behaviors, thoughts and emotions.  The patient's profile on the pain patient profile (P3) does show mild elevations in both somatic complaints and anxiety and depressive features relative to a community based normative sample without chronic pain symptoms.  However, when she is compared against chronic pain patients without significant psychiatric illness, the patient does not have significant elevations in any features related to depression, anxiety or indications of some matization or somatoform disorder.  Impression/Diagnosis:   Overall, the results of both the clinical information including subjective reports and available medical records as well as objective psychological evaluation including the MMPI-2 and the pain patient profile, do suggest that the patient is an excellent candidate for spinal cord stimulator trialing procedures and possible implantation.  There were no objective indications of significant psychiatric illness or severe and overwhelming symptoms of depression and anxiety that would go beyond those typical of chronic pain patients and in fact her level of depression anxiety symptoms are below normative variables on chronic pain patients.  There are mild elevations relative to a normative population for depression anxiety.  The patient denies any significant psychosocial stressors that would complicate her ability to accurately interpret her pain symptoms during the trialing phase and the patient's cognitive functioning and psychological coping skills suggest that she would be able to manage the trialing phase and possible full implantation quite well.  Again, the patient appears to be an excellent candidate for  spinal cord stimulator trialing procedures and possible implantation's.  As far as recommendations I would recommend that the patient continue or restart her Lexapro SSRI medication.  It is possible that she would experience some improvements in pain if we tried Cymbalta but it does sound like her Lexapro has done well in the past.  The degree of anxiety and depression does not appear to be to the point that psychotherapeutic interventions would be needed and her level of difficulties are consistent with her subjective reports of pain/chronic pain symptoms.  Diagnosis:    Axis I: Chronic pain syndrome  Lumbar post-laminectomy syndrome  Depression with anxiety   Ilean Skill, Psy.D. Neuropsychologist

## 2020-03-19 ENCOUNTER — Ambulatory Visit: Payer: Medicare Other | Admitting: Psychology

## 2020-04-23 ENCOUNTER — Ambulatory Visit: Payer: Self-pay | Admitting: Orthopedic Surgery

## 2020-04-26 NOTE — Progress Notes (Signed)
[place], [place] [address] Phone: [phone] Fax: [phone]   Your procedure is scheduled on Wednesday, June 30th.  Report to [facility] Main Entrance "A" at 8:00 A.M., and check in at the Admitting office.  Call this number if you have problems the morning of surgery:  [phone]  Call [phone] if you have any questions prior to your surgery date Monday-Friday 8am-4pm   Remember:  Do not eat or drink after midnight the night before your surgery    Take these medicines the morning of surgery with A SIP OF WATER  carvedilol (COREG) escitalopram (LEXAPRO) levothyroxine (SYNTHROID, LEVOTHROID)  LORazepam (ATIVAN)  omeprazole (PRILOSEC OTC) SYSTANE/eye drops  As of today, STOP taking any Aspirin (unless otherwise instructed by your surgeon) and Aspirin containing products, Aleve, Naproxen, Ibuprofen, Motrin, Advil, Goody's, BC's, all herbal medications, fish oil, and all vitamins.          WHAT DO I DO ABOUT MY DIABETES MEDICATION?  - The morning of surgery Do NOT take metFORMIN (GLUCOPHAGE)   HOW TO MANAGE YOUR DIABETES BEFORE AND AFTER SURGERY  Why is it important to control my blood sugar before and after surgery? . Improving blood sugar levels before and after surgery helps healing and can limit problems. . A way of improving blood sugar control is eating a healthy diet by: o  Eating less sugar and carbohydrates o  Increasing activity/exercise o  Talking with your doctor about reaching your blood sugar goals . High blood sugars (greater than 180 mg/dL) can raise your risk of infections and slow your recovery, so you will need to focus on controlling your diabetes during the weeks before surgery. . Make sure that the doctor who takes care of your diabetes knows about your planned surgery including the date and location.  How do I manage my blood sugar before surgery? . Check your blood sugar  at least 4 times a day, starting 2 days before surgery, to make sure that the level is not too high or low. . Check your blood sugar the morning of your surgery when you wake up and every 2 hours until you get to the Short Stay unit. o If your blood sugar is less than 70 mg/dL, you will need to treat for low blood sugar: - Do not take insulin. - Treat a low blood sugar (less than 70 mg/dL) with  cup of clear juice (cranberry or apple), 4 glucose tablets, OR glucose gel. - Recheck blood sugar in 15 minutes after treatment (to make sure it is greater than 70 mg/dL). If your blood sugar is not greater than 70 mg/dL on recheck, call [phone] for further instructions. . Report your blood sugar to the short stay nurse when you get to Short Stay.  . If you are admitted to the hospital after surgery: o Your blood sugar will be checked by the staff and you will probably be given insulin after surgery (instead of oral diabetes medicines) to make sure you have good blood sugar levels. o The goal for blood sugar control after surgery is 80-180 mg/dL.             Do not wear jewelry, make up, or nail polish            Do not wear lotions, powders, perfumes, or deodorant.            Do not shave 48 hours prior to surgery.  Do not bring valuables to the hospital.            [facility] is not responsible for any belongings or valuables.  Do NOT Smoke (Tobacco/Vapping) or drink Alcohol 24 hours prior to your procedure If you use a CPAP at night, you may bring all equipment for your overnight stay.   Contacts, glasses, dentures or bridgework may not be worn into surgery.      For patients admitted to the hospital, discharge time will be determined by your treatment team.   Patients discharged the day of surgery will not be allowed to drive home, and someone needs to stay with them for 24 hours.  Special instructions:   [place]- Preparing For Surgery  Before surgery, you can play an  important role. Because skin is not sterile, your skin needs to be as free of germs as possible. You can reduce the number of germs on your skin by washing with CHG (chlorahexidine gluconate) Soap before surgery.  CHG is an antiseptic cleaner which kills germs and bonds with the skin to continue killing germs even after washing.    Oral Hygiene is also important to reduce your risk of infection.  Remember - BRUSH YOUR TEETH THE MORNING OF SURGERY WITH YOUR REGULAR TOOTHPASTE  Please do not use if you have an allergy to CHG or antibacterial soaps. If your skin becomes reddened/irritated stop using the CHG.  Do not shave (including legs and underarms) for at least 48 hours prior to first CHG shower. It is OK to shave your face.  Please follow these instructions carefully.   1. Shower the NIGHT BEFORE SURGERY and the MORNING OF SURGERY with CHG Soap.   2. If you chose to wash your hair, wash your hair first as usual with your normal shampoo.  3. After you shampoo, rinse your hair and body thoroughly to remove the shampoo.  4. Use CHG as you would any other liquid soap. You can apply CHG directly to the skin and wash gently with a scrungie or a clean washcloth.   5. Apply the CHG Soap to your body ONLY FROM THE NECK DOWN.  Do not use on open wounds or open sores. Avoid contact with your eyes, ears, mouth and genitals (private parts). Wash Face and genitals (private parts)  with your normal soap.   6. Wash thoroughly, paying special attention to the area where your surgery will be performed.  7. Thoroughly rinse your body with warm water from the neck down.  8. DO NOT shower/wash with your normal soap after using and rinsing off the CHG Soap.  9. Pat yourself dry with a CLEAN TOWEL.  10. Wear CLEAN PAJAMAS to bed the night before surgery, wear comfortable clothes the morning of surgery  11. Place CLEAN SHEETS on your bed the night of your first shower and DO NOT SLEEP WITH PETS.  Day of  Surgery: Shower wit CHG soap as instructed above.  Do not apply any deodorants/lotions.  Please wear clean clothes to the hospital/surgery center.   Remember to brush your teeth WITH YOUR REGULAR TOOTHPASTE.   Please read over the following fact sheets that you were given.

## 2020-04-26 NOTE — Progress Notes (Signed)
LVM for pt not show up tomorrow 6/25 for her covid test as she was scheduled too early for her procedure on Wed 6/30. The pt needs to come staring Sat 6/26, Mon 6/28, or Tues 6/29 at the latest. Testing times are M-F 8am-3:15pm and Sat 9am-12:30pm.

## 2020-04-27 ENCOUNTER — Other Ambulatory Visit: Payer: Self-pay

## 2020-04-27 ENCOUNTER — Encounter (HOSPITAL_COMMUNITY)
Admission: RE | Admit: 2020-04-27 | Discharge: 2020-04-27 | Disposition: A | Discharge status: Home / Self Care | Payer: Medicare Other | Source: Ambulatory Visit | Attending: Orthopedic Surgery | Admitting: Orthopedic Surgery

## 2020-04-27 ENCOUNTER — Ambulatory Visit (HOSPITAL_COMMUNITY)
Admission: RE | Admit: 2020-04-27 | Discharge: 2020-04-27 | Disposition: A | Discharge status: Home / Self Care | Payer: Medicare Other | Source: Ambulatory Visit | Attending: Orthopedic Surgery | Admitting: Orthopedic Surgery

## 2020-04-27 ENCOUNTER — Inpatient Hospital Stay (HOSPITAL_COMMUNITY)
Admission: RE | Admit: 2020-04-27 | Discharge: 2020-04-27 | Disposition: A | Discharge status: Home / Self Care | Payer: Medicare Other | Source: Ambulatory Visit

## 2020-04-27 ENCOUNTER — Ambulatory Visit: Payer: Self-pay | Admitting: Orthopedic Surgery

## 2020-04-27 ENCOUNTER — Encounter (HOSPITAL_COMMUNITY): Payer: Self-pay

## 2020-04-27 DIAGNOSIS — Z79899 Other long term (current) drug therapy: Secondary | ICD-10-CM | POA: Insufficient documentation

## 2020-04-27 DIAGNOSIS — I10 Essential (primary) hypertension: Secondary | ICD-10-CM | POA: Insufficient documentation

## 2020-04-27 DIAGNOSIS — K219 Gastro-esophageal reflux disease without esophagitis: Secondary | ICD-10-CM | POA: Insufficient documentation

## 2020-04-27 DIAGNOSIS — M961 Postlaminectomy syndrome, not elsewhere classified: Secondary | ICD-10-CM | POA: Diagnosis not present

## 2020-04-27 DIAGNOSIS — Z79890 Hormone replacement therapy (postmenopausal): Secondary | ICD-10-CM | POA: Diagnosis not present

## 2020-04-27 DIAGNOSIS — E890 Postprocedural hypothyroidism: Secondary | ICD-10-CM | POA: Insufficient documentation

## 2020-04-27 DIAGNOSIS — Z7984 Long term (current) use of oral hypoglycemic drugs: Secondary | ICD-10-CM | POA: Insufficient documentation

## 2020-04-27 DIAGNOSIS — Z85820 Personal history of malignant melanoma of skin: Secondary | ICD-10-CM | POA: Insufficient documentation

## 2020-04-27 DIAGNOSIS — Z01818 Encounter for other preprocedural examination: Secondary | ICD-10-CM | POA: Insufficient documentation

## 2020-04-27 DIAGNOSIS — E119 Type 2 diabetes mellitus without complications: Secondary | ICD-10-CM | POA: Diagnosis not present

## 2020-04-27 DIAGNOSIS — Z981 Arthrodesis status: Secondary | ICD-10-CM | POA: Insufficient documentation

## 2020-04-27 HISTORY — DX: Gastro-esophageal reflux disease without esophagitis: K21.9

## 2020-04-27 LAB — URINALYSIS, ROUTINE W REFLEX MICROSCOPIC
Bilirubin Urine: NEGATIVE
Glucose, UA: NEGATIVE mg/dL
Hgb urine dipstick: NEGATIVE
Ketones, ur: NEGATIVE mg/dL
Leukocytes,Ua: NEGATIVE
Nitrite: NEGATIVE
Protein, ur: NEGATIVE mg/dL
Specific Gravity, Urine: 1.019 (ref 1.005–1.030)
pH: 5 (ref 5.0–8.0)

## 2020-04-27 LAB — BASIC METABOLIC PANEL
Anion gap: 11 (ref 5–15)
BUN: 14 mg/dL (ref 8–23)
CO2: 27 mmol/L (ref 22–32)
Calcium: 9.3 mg/dL (ref 8.9–10.3)
Chloride: 101 mmol/L (ref 98–111)
Creatinine, Ser: 0.98 mg/dL (ref 0.44–1.00)
GFR calc Af Amer: >60 mL/min (ref >60)
GFR calc non Af Amer: 56 mL/min — ABNORMAL LOW (ref >60)
Glucose, Bld: 98 mg/dL (ref 70–99)
Potassium: 3.9 mmol/L (ref 3.5–5.1)
Sodium: 139 mmol/L (ref 135–145)

## 2020-04-27 LAB — CBC
HCT: 38.8 % (ref 36.0–46.0)
Hemoglobin: 12.7 g/dL (ref 12.0–15.0)
MCH: 30.7 pg (ref 26.0–34.0)
MCHC: 32.7 g/dL (ref 30.0–36.0)
MCV: 93.7 fL (ref 80.0–100.0)
Platelets: 281 10*3/uL (ref 150–400)
RBC: 4.14 MIL/uL (ref 3.87–5.11)
RDW: 12.7 % (ref 11.5–15.5)
WBC: 7.5 10*3/uL (ref 4.0–10.5)
nRBC: 0 % (ref 0.0–0.2)

## 2020-04-27 LAB — PROTIME-INR
INR: 0.9 (ref 0.8–1.2)
Prothrombin Time: 12.1 seconds (ref 11.4–15.2)

## 2020-04-27 LAB — HEMOGLOBIN A1C
Hgb A1c MFr Bld: 6 % — ABNORMAL HIGH (ref 4.8–5.6)
Mean Plasma Glucose: 125.5 mg/dL

## 2020-04-27 LAB — SURGICAL PCR SCREEN
MRSA, PCR: NEGATIVE
Staphylococcus aureus: NEGATIVE

## 2020-04-27 LAB — GLUCOSE, CAPILLARY: Glucose-Capillary: 89 mg/dL (ref 70–99)

## 2020-04-27 LAB — APTT: aPTT: 38 seconds — ABNORMAL HIGH (ref 24–36)

## 2020-04-27 NOTE — H&P (Deleted)
  The note originally documented on this encounter has been moved the the encounter in which it belongs.  

## 2020-04-27 NOTE — H&P (Signed)
Subjective:   Heidi Thompson is a very pleasant 74 year old and who is about 2 years out from a multilevel lumbar spinal fusion for chronic debilitating back pain. Initially she did well but she is been having progressive loss in quality-of-life and pain. She ultimately had a spinal cord stimulator trial and noted significant improvement in her quality-of-life. Given the positive results of the trial she presents today for definitive implantation. The patient is here today for a pre-operative History and Physical. They are scheduled for Spinal Cord Stimulator on 05/02/20 with Dr. Rolena Infante at [facility].  Patient Active Problem List   Diagnosis Date Noted  . S/P lumbar fusion 07/14/2018  . Neck pain 03/18/2017   Past Medical History:  Diagnosis Date  . Acid reflux   . Anxiety   . Arthritis    spondylotic radiculopathy, neck, back, shoulder   . Cancer ([place]) 1978   melanoma- on R leg, removed  . Diabetes mellitus without complication ([place])   . H/O total thyroidectomy 1982  . Heart murmur   . History of kidney stones    on imaging, saw 2 stones in kidney & pt. reports that she thinks she has passed one on her own at one time.   . Hypertension   . PONV (postoperative nausea and vomiting)     Past Surgical History:  Procedure Laterality Date  . ABDOMINAL HYSTERECTOMY    . ANTERIOR CERVICAL DECOMP/DISCECTOMY FUSION N/A 03/18/2017   Procedure: ACDF C4-7;  Surgeon: Melina Schools, MD;  Location: [place];  Service: Orthopedics;  Laterality: N/A;  Requests 4 hours  . ANTERIOR LAT LUMBAR FUSION N/A 07/14/2018   Procedure: Anterior lateral lumbar fusion L2-4;  Surgeon: Melina Schools, MD;  Location: [place];  Service: Orthopedics;  Laterality: N/A;  7 hrs  . APPENDECTOMY    . BACK SURGERY    . CHOLECYSTECTOMY    . EYE SURGERY Bilateral    cataracts /w IOL  . JOINT REPLACEMENT Bilateral    knees  . SHOULDER ARTHROSCOPY Right 2016  . thumb joi    . thumb Joint  Right    thumb joint On R hand  replaced  . TONSILLECTOMY    . TOTAL THYROIDECTOMY      Current Outpatient Medications  Medication Sig Dispense Refill Last Dose  . atorvastatin (LIPITOR) 20 MG tablet Take 20 mg by mouth at bedtime.      . carvedilol (COREG) 25 MG tablet Take 12.5 mg by mouth 2 (two) times daily.      . chlorthalidone (HYGROTON) 25 MG tablet Take 25 mg by mouth every evening.      . Cholecalciferol (VITAMIN D3) 2000 units TABS Take 2,000 Units by mouth daily.     Marland Kitchen escitalopram (LEXAPRO) 20 MG tablet Take 20 mg by mouth daily before breakfast.      . ferrous sulfate 325 (65 FE) MG tablet Take 325 mg by mouth daily with breakfast.     . levothyroxine (SYNTHROID, LEVOTHROID) 50 MCG tablet Take 50 mcg by mouth daily before breakfast. 2 hours after breakfast     . LORazepam (ATIVAN) 1 MG tablet Take 1 mg by mouth 2 (two) times daily.     . metFORMIN (GLUCOPHAGE) 500 MG tablet Take 500 mg by mouth every evening.     . Multiple Vitamins-Minerals (MULTIVITAMIN WITH MINERALS) tablet Take 1 tablet by mouth daily.     Marland Kitchen omeprazole (PRILOSEC OTC) 20 MG tablet Take 20 mg by mouth daily before breakfast.      .  Polyethyl Glycol-Propyl Glycol (SYSTANE OP) Place 1 drop into both eyes 2 (two) times daily.      . potassium chloride SA (K-DUR,KLOR-CON) 20 MEQ tablet Take 20 mEq by mouth daily with lunch.      . valsartan (DIOVAN) 80 MG tablet Take 80 mg by mouth daily.      No current facility-administered medications for this visit.   Allergies  Allergen Reactions  . Azithromycin     Stomach Pain    Social History   Tobacco Use  . Smoking status: Never Smoker  . Smokeless tobacco: Never Used  Substance Use Topics  . Alcohol use: No    No family history on file.  Review of Systems As stated in HPI  Objective:   Vitals:  Ht: 5 ft 3 in 04/27/2020 10:03 am BP: 148/64 04/27/2020 10:08 am  Clinical exam: Heidi Thompson is a pleasant individual, who appears younger than their stated age. She is alert and orientated  3. No shortness of breath, chest pain. Abdomen is soft and non-tender, negative loss of bowel and bladder control, no rebound tenderness. Negative: skin lesions abrasions contusions Cardiac: Regular rate and rhythm no rubs gallops murmurs Lungs: Clear to auscultation bilaterally Peripheral pulses: 2+ dorsalis pedis/posterior tibialis pulses bilaterally. Compartment soft and nontender. Gait pattern: Slightly altered gait pattern due to chronic low back pain. Assistive devices: None Neuro: 5/5 motor strength bilaterally in the lower extremity. Positive intermittent dysesthesias into the lower extremity with neuropathic pain. Negative Babinski test, no clonus, symmetrical 1+ deep tendon reflexes. Musculoskeletal: Well-healed surgical scar from previous L2-5 instrumented fusion. Ongoing significant back buttock and bilateral neuropathic leg pain. Thoracic MRI: completed on 04/17/20 was reviewed with the patient. It was completed at [facility]; I have independently reviewed the images as well as the radiology report. No cord signal change. No fracture or abnormal marrow signal change. There is a slight wedge deformity of T1. 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 No contraindications to place and spinal cord stimulator lead as she has no severe spinal stenosis.  Assessment:   Heidi Thompson is a very pleasant 74 year old and who is about 2 years out from a multilevel lumbar spinal fusion for chronic debilitating back pain. Initially she did well but she is been having progressive loss in quality-of-life and pain. She ultimately had a spinal cord stimulator trial and noted significant improvement in her quality-of-life. Given the positive results of the trial she presents today for definitive implantation. I have gone over the surgical procedure in great detail including the risks and benefits of surgery. All of her questions were encouraged and addressed.  Plan:    Risks and benefits of surgery were discussed with the patient. These include: Infection, bleeding, death, stroke, paralysis, ongoing or worse pain, need for additional surgery, leak of spinal fluid, Failure of the battery requiring reoperation. Inability to place the paddle requiring the surgery to be aborted. Migration of the lead, failure to obtain results similar to the trial. We have obtained preoperative medical clearance from the patient's primary care provider.  I reviewed the patient's medication list with her. She is not on any blood thinners. She is not to be any aspirin. She is taking meloxicam which I have advised her to hold 1 week prior to surgery. She will also hold her multivitamin. She will continue taking her potassium supplement as this due to her blood pressure medication which she will remain on. I also advised her not to use any Anti-inflammatory medications  leading up to surgery. She did express understanding of this.  We have also discussed the post-operative recovery period to include: bathing/showering restrictions, wound healing, activity (and driving) restrictions, medications/pain mangement.  We have also discussed post-operative redflags to include: signs and symptoms of postoperative infection, DVT/PE.  Patient went to [facility] this morning for standard preoperative testing.  All patients questions were invited and answered  Follow-up: 2 weeks postoperatively

## 2020-04-27 NOTE — Progress Notes (Signed)
PCP - [name], FNP  Cardiologist - Dr. Raechel Chute ([place]) - records requested  PPM/ICD - denies  Chest x-ray - 04/27/2020 EKG - tracing requested Stress Test - denies ECHO - per patient "done about 2-3 years ago no abnormal findings" Cardiac Cath - denies  Sleep Study - denies  CPAP - N/A  DM: per patient pre-diabetic  CBG @PAT  appointment: 89  Blood Thinner Instructions: Aspirin Instructions:  ERAS Protcol - PRE-SURGERY Ensure or G2-   COVID TEST- Scheduled for 04/28/2020. Patient verbalized understanding of self-quarantine instructions, appointment time and place.  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 Patient agrees to go over the instructions while at home for a better understanding. Patient also instructed to self quarantine after being tested for COVID-19. The opportunity to ask questions was provided.

## 2020-04-28 ENCOUNTER — Other Ambulatory Visit (HOSPITAL_COMMUNITY)
Admission: RE | Admit: 2020-04-28 | Discharge: 2020-04-28 | Disposition: A | Discharge status: Home / Self Care | Payer: Medicare Other | Source: Ambulatory Visit | Attending: Orthopedic Surgery | Admitting: Orthopedic Surgery

## 2020-04-28 DIAGNOSIS — Z20822 Contact with and (suspected) exposure to covid-19: Secondary | ICD-10-CM | POA: Diagnosis not present

## 2020-04-28 DIAGNOSIS — Z01812 Encounter for preprocedural laboratory examination: Secondary | ICD-10-CM | POA: Insufficient documentation

## 2020-04-28 LAB — SARS CORONAVIRUS 2 (TAT 6-24 HRS): SARS Coronavirus 2: NEGATIVE

## 2020-04-30 ENCOUNTER — Other Ambulatory Visit (HOSPITAL_COMMUNITY): Payer: Medicare Other

## 2020-04-30 NOTE — Anesthesia Preprocedure Evaluation (Addendum)
Anesthesia Evaluation  Patient identified by MRN, date of birth, ID band Patient awake    Reviewed: Allergy & Precautions, H&P , NPO status , Patient's Chart, lab work & pertinent test results, reviewed documented beta blocker date and time   History of Anesthesia Complications (+) PONV  Airway Mallampati: II  TM Distance: >3 FB Neck ROM: Full    Dental no notable dental hx. (+) Teeth Intact, Dental Advisory Given   Pulmonary neg pulmonary ROS,    Pulmonary exam normal breath sounds clear to auscultation       Cardiovascular hypertension, Pt. on medications and Pt. on home beta blockers  Rhythm:Regular Rate:Normal + Systolic murmurs    Neuro/Psych Anxiety negative neurological ROS     GI/Hepatic Neg liver ROS, GERD  Medicated,  Endo/Other  diabetes  Renal/GU negative Renal ROS  negative genitourinary   Musculoskeletal  (+) Arthritis , Osteoarthritis,    Abdominal   Peds  Hematology negative hematology ROS (+)   Anesthesia Other Findings   Reproductive/Obstetrics negative OB ROS                            Anesthesia Physical Anesthesia Plan  ASA: II  Anesthesia Plan: General   Post-op Pain Management:    Induction: Intravenous  PONV Risk Score and Plan: 4 or greater and Ondansetron, Dexamethasone and Midazolam  Airway Management Planned: Oral ETT  Additional Equipment:   Intra-op Plan:   Post-operative Plan: Extubation in OR  Informed Consent: I have reviewed the patients History and Physical, chart, labs and discussed the procedure including the risks, benefits and alternatives for the proposed anesthesia with the patient or authorized representative who has indicated his/her understanding and acceptance.     Dental advisory given  Plan Discussed with: CRNA  Anesthesia Plan Comments: (PAT note written by Myra Gianotti, PA-C. )       Anesthesia Quick  Evaluation

## 2020-04-30 NOTE — Progress Notes (Addendum)
Anesthesia Chart Review:  Case: [id number] Date/Time: 05/02/20 0945   Procedure: SPINAL CORD STIMULATOR INSERTION (N/A ) - 2.5 hrs   Anesthesia type: General   Pre-op diagnosis: Failed back syndrome   Location: [facility] OR ROOM 04 / [place] OR   Surgeons: Melina Schools, MD      DISCUSSION: Patient is a 74 year old female scheduled for the above procedure.  History includes never smoker, post-operative N/V, HTN, heart murmur,DM,melanoma (RLE, s/p excision 1978), thyroidectomy (1982), acid reflux. S/p C4-7 ACDF 03/18/2017. S/p L2-5 fusion 07/14/18.   Reviewed cardiology records received from [place]. Last evaluation by cardiologist Raechel Chute, MD was on 03/13/20. He noted patient with "multiple medical problems and cardiovascular risk factors but no established cardiovascular disease yet." She has chronic mild BLE aggravated by "poor level of ambulatory activity due to her multiple musculoskeletal problems and most recently issues with severe lower back pain and nephrolithiasis." Edema was felt controlled. He noted that she was being evaluated for possible pain stimulator. He also noted a recent ED visit for renal colic with severe hypertension felt related to pain. Her cardiac troponin was 0.47, but he felt this was "likely due to hypertensive urgency alone as there were no associated cardiac complaints." He is planning TTE "during the summer" for exertional dyspnea, but not done yet (scheduled for 06/27/20).  He noted non-ischemic Dobutamine TTE 11/06/15 for chest pain evaluation. EKG NSR.   Reviewed cardiology notes with anesthesiologist Lyn Hollingshead, MD. Anesthesia team to evaluate on the day of surgery.   04/28/20 COVID-19 test negative.    VS: BP (!) 157/64   Pulse 60   Temp (!) 36.3 C (Oral)   Resp 20   Ht 5' 3.5" (1.613 m)   Wt 78.6 kg   SpO2 99%   BMI 30.21 kg/m    PROVIDERS: [name], FNP is PCP  Raechel Chute, MD is cardiologist ([place])   LABS: Labs reviewed: Acceptable for surgery. (all labs ordered are listed, but only abnormal results are displayed)  Labs Reviewed  APTT - Abnormal; Notable for the following components:      Result Value   aPTT 38 (*)    All other components within normal limits  BASIC METABOLIC PANEL - Abnormal; Notable for the following components:   GFR calc non Af Amer 56 (*)    All other components within normal limits  HEMOGLOBIN A1C - Abnormal; Notable for the following components:   Hgb A1c MFr Bld 6.0 (*)    All other components within normal limits  SURGICAL PCR SCREEN  GLUCOSE, CAPILLARY  CBC  PROTIME-INR  URINALYSIS, ROUTINE W REFLEX MICROSCOPIC     IMAGES: CXR 04/27/20: FINDINGS: The heart size and mediastinal contours are within normal limits. Both lungs are clear. Disc degenerative disease of the thoracic spine. Partially imaged cervical and lumbar fusion hardware. IMPRESSION: No acute abnormality of the lungs.   EKG: 03/13/20 ([facility]): SR within normal limits.   CV: Stress echo 11/06/15 [facility], scanned in media tab under correspondence 03/20/2017): 1. Clinically negative test is a low level of workload. Note is made to the patient is on chronic beta blocker therapy. 2. Electrocardiographically no evidence of induced ischemia given level of workload. 3. Echocardiogram shows normal LV systolic function at rest with EF of about 60% and normal augmentation of contractility post exercise. Almost to complete cavity obliteration in systole. 4. There is no documented dynamic gradient. 5. LA size grossly normal and RV  systolic pressure also appears normal. On the other hand, the patient appears to have a grade 2 diastolic filling pattern with indeterminate LV filling pressures by tissue Doppler analysis.   Past Medical History:  Diagnosis Date  . Acid reflux   . Anxiety   . Arthritis    spondylotic radiculopathy, neck, back, shoulder   . Cancer  ([place]) 1978   melanoma- on R leg, removed  . Diabetes mellitus without complication ([place])   . H/O total thyroidectomy 1982  . Heart murmur   . History of kidney stones    on imaging, saw 2 stones in kidney & pt. reports that she thinks she has passed one on her own at one time.   . Hypertension   . PONV (postoperative nausea and vomiting)     Past Surgical History:  Procedure Laterality Date  . ABDOMINAL HYSTERECTOMY    . ANTERIOR CERVICAL DECOMP/DISCECTOMY FUSION N/A 03/18/2017   Procedure: ACDF C4-7;  Surgeon: Melina Schools, MD;  Location: [place];  Service: Orthopedics;  Laterality: N/A;  Requests 4 hours  . ANTERIOR LAT LUMBAR FUSION N/A 07/14/2018   Procedure: Anterior lateral lumbar fusion L2-4;  Surgeon: Melina Schools, MD;  Location: [place] [place];  Service: Orthopedics;  Laterality: N/A;  7 hrs  . APPENDECTOMY    . BACK SURGERY    . CHOLECYSTECTOMY    . EYE SURGERY Bilateral    cataracts /w IOL  . JOINT REPLACEMENT Bilateral    knees  . SHOULDER ARTHROSCOPY Right 2016  . thumb joi    . thumb Joint  Right    thumb joint On R hand replaced  . TONSILLECTOMY    . TOTAL THYROIDECTOMY      MEDICATIONS: . atorvastatin (LIPITOR) 20 MG tablet  . carvedilol (COREG) 25 MG tablet  . chlorthalidone (HYGROTON) 25 MG tablet  . Cholecalciferol (VITAMIN D3) 2000 units TABS  . escitalopram (LEXAPRO) 20 MG tablet  . ferrous sulfate 325 (65 FE) MG tablet  . levothyroxine (SYNTHROID, LEVOTHROID) 50 MCG tablet  . LORazepam (ATIVAN) 1 MG tablet  . metFORMIN (GLUCOPHAGE) 500 MG tablet  . Multiple Vitamins-Minerals (MULTIVITAMIN WITH MINERALS) tablet  . omeprazole (PRILOSEC OTC) 20 MG tablet  . Polyethyl Glycol-Propyl Glycol (SYSTANE OP)  . potassium chloride SA (K-DUR,KLOR-CON) 20 MEQ tablet  . valsartan (DIOVAN) 80 MG tablet   No current facility-administered medications for this encounter.    Myra Gianotti, PA-C Surgical Short Stay/Anesthesiology [facility] Phone [phone] [facility] Phone  [phone] 04/30/2020 6:27 PM

## 2020-05-02 ENCOUNTER — Encounter (HOSPITAL_COMMUNITY)
Admission: RE | Disposition: A | Discharge status: Home / Self Care | Payer: Self-pay | Source: Home / Self Care | Attending: Orthopedic Surgery

## 2020-05-02 ENCOUNTER — Other Ambulatory Visit: Payer: Self-pay

## 2020-05-02 ENCOUNTER — Ambulatory Visit (HOSPITAL_COMMUNITY): Payer: Medicare Other

## 2020-05-02 ENCOUNTER — Encounter (HOSPITAL_COMMUNITY): Payer: Self-pay | Admitting: Orthopedic Surgery

## 2020-05-02 ENCOUNTER — Observation Stay (HOSPITAL_COMMUNITY)
Admission: RE | Admit: 2020-05-02 | Discharge: 2020-05-03 | Disposition: A | Discharge status: Home / Self Care | Payer: Medicare Other | Attending: Orthopedic Surgery | Admitting: Orthopedic Surgery

## 2020-05-02 ENCOUNTER — Ambulatory Visit (HOSPITAL_COMMUNITY): Payer: Medicare Other | Admitting: Certified Registered Nurse Anesthetist

## 2020-05-02 ENCOUNTER — Ambulatory Visit (HOSPITAL_COMMUNITY): Payer: Medicare Other | Admitting: Vascular Surgery

## 2020-05-02 DIAGNOSIS — G8929 Other chronic pain: Secondary | ICD-10-CM | POA: Diagnosis present

## 2020-05-02 DIAGNOSIS — E119 Type 2 diabetes mellitus without complications: Secondary | ICD-10-CM | POA: Diagnosis not present

## 2020-05-02 DIAGNOSIS — Y838 Other surgical procedures as the cause of abnormal reaction of the patient, or of later complication, without mention of misadventure at the time of the procedure: Secondary | ICD-10-CM | POA: Insufficient documentation

## 2020-05-02 DIAGNOSIS — Z85820 Personal history of malignant melanoma of skin: Secondary | ICD-10-CM | POA: Diagnosis not present

## 2020-05-02 DIAGNOSIS — Z79890 Hormone replacement therapy (postmenopausal): Secondary | ICD-10-CM | POA: Insufficient documentation

## 2020-05-02 DIAGNOSIS — Z79899 Other long term (current) drug therapy: Secondary | ICD-10-CM | POA: Insufficient documentation

## 2020-05-02 DIAGNOSIS — Z96653 Presence of artificial knee joint, bilateral: Secondary | ICD-10-CM | POA: Insufficient documentation

## 2020-05-02 DIAGNOSIS — I10 Essential (primary) hypertension: Secondary | ICD-10-CM | POA: Diagnosis not present

## 2020-05-02 DIAGNOSIS — M961 Postlaminectomy syndrome, not elsewhere classified: Principal | ICD-10-CM | POA: Insufficient documentation

## 2020-05-02 DIAGNOSIS — K219 Gastro-esophageal reflux disease without esophagitis: Secondary | ICD-10-CM | POA: Diagnosis not present

## 2020-05-02 DIAGNOSIS — Z419 Encounter for procedure for purposes other than remedying health state, unspecified: Secondary | ICD-10-CM

## 2020-05-02 DIAGNOSIS — Z7984 Long term (current) use of oral hypoglycemic drugs: Secondary | ICD-10-CM | POA: Insufficient documentation

## 2020-05-02 DIAGNOSIS — F419 Anxiety disorder, unspecified: Secondary | ICD-10-CM | POA: Insufficient documentation

## 2020-05-02 HISTORY — PX: SPINAL CORD STIMULATOR INSERTION: SHX5378

## 2020-05-02 LAB — GLUCOSE, CAPILLARY
Glucose-Capillary: 106 mg/dL — ABNORMAL HIGH (ref 70–99)
Glucose-Capillary: 124 mg/dL — ABNORMAL HIGH (ref 70–99)
Glucose-Capillary: 149 mg/dL — ABNORMAL HIGH (ref 70–99)
Glucose-Capillary: 189 mg/dL — ABNORMAL HIGH (ref 70–99)

## 2020-05-02 SURGERY — INSERTION, SPINAL CORD STIMULATOR, LUMBAR
Anesthesia: General

## 2020-05-02 MED ORDER — ONDANSETRON HCL 4 MG/2ML IJ SOLN
INTRAMUSCULAR | Status: AC
  Filled 2020-05-02: qty 2

## 2020-05-02 MED ORDER — LORAZEPAM 0.5 MG PO TABS
1.0000 mg | ORAL_TABLET | Freq: Two times a day (BID) | ORAL | Status: DC
  Administered 2020-05-02: 1 mg via ORAL
  Filled 2020-05-02: qty 2

## 2020-05-02 MED ORDER — ADULT MULTIVITAMIN W/MINERALS CH: 1.0000 | ORAL_TABLET | Freq: Every day | ORAL | Status: DC

## 2020-05-02 MED ORDER — SUGAMMADEX SODIUM 200 MG/2ML IV SOLN
INTRAVENOUS | Status: DC | PRN
  Administered 2020-05-02: 200 mg via INTRAVENOUS

## 2020-05-02 MED ORDER — POTASSIUM CHLORIDE CRYS ER 20 MEQ PO TBCR: 20.0000 meq | EXTENDED_RELEASE_TABLET | Freq: Every day | ORAL | Status: DC

## 2020-05-02 MED ORDER — PHENYLEPHRINE HCL-NACL 10-0.9 MG/250ML-% IV SOLN
INTRAVENOUS | Status: DC | PRN
Start: 2020-05-02 — End: 2020-05-02
  Administered 2020-05-02: 50 ug/min via INTRAVENOUS

## 2020-05-02 MED ORDER — PROPOFOL 10 MG/ML IV BOLUS
INTRAVENOUS | Status: DC | PRN
  Administered 2020-05-02: 80 mg via INTRAVENOUS

## 2020-05-02 MED ORDER — CHLORHEXIDINE GLUCONATE 0.12 % MT SOLN
15.0000 mL | Freq: Once | OROMUCOSAL | Status: AC
  Administered 2020-05-02: 15 mL via OROMUCOSAL
  Filled 2020-05-02: qty 15

## 2020-05-02 MED ORDER — INSULIN ASPART 100 UNIT/ML ~~LOC~~ SOLN
0.0000 [IU] | Freq: Three times a day (TID) | SUBCUTANEOUS | Status: DC
  Administered 2020-05-03: 3 [IU] via SUBCUTANEOUS

## 2020-05-02 MED ORDER — HEMOSTATIC AGENTS (NO CHARGE) OPTIME
TOPICAL | Status: DC | PRN
  Administered 2020-05-02: 1 via TOPICAL

## 2020-05-02 MED ORDER — ONDANSETRON HCL 4 MG PO TABS: 4.0000 mg | ORAL_TABLET | Freq: Three times a day (TID) | ORAL | 0 refills | Status: DC | PRN

## 2020-05-02 MED ORDER — SODIUM CHLORIDE 0.9% FLUSH: 3.0000 mL | INTRAVENOUS | Status: DC | PRN

## 2020-05-02 MED ORDER — OXYCODONE HCL 5 MG PO TABS
10.0000 mg | ORAL_TABLET | ORAL | Status: DC | PRN
  Administered 2020-05-02 – 2020-05-03 (×4): 10 mg via ORAL
  Filled 2020-05-02 (×4): qty 2

## 2020-05-02 MED ORDER — ROCURONIUM BROMIDE 10 MG/ML (PF) SYRINGE
PREFILLED_SYRINGE | INTRAVENOUS | Status: AC
  Filled 2020-05-02: qty 10

## 2020-05-02 MED ORDER — POLYETHYLENE GLYCOL 3350 17 G PO PACK: 17.0000 g | PACK | Freq: Every day | ORAL | Status: DC | PRN

## 2020-05-02 MED ORDER — METHOCARBAMOL 500 MG PO TABS
ORAL_TABLET | ORAL | Status: AC
  Filled 2020-05-02: qty 1

## 2020-05-02 MED ORDER — MIDAZOLAM HCL 2 MG/2ML IJ SOLN
INTRAMUSCULAR | Status: DC | PRN
  Administered 2020-05-02: 1 mg via INTRAVENOUS

## 2020-05-02 MED ORDER — PANTOPRAZOLE SODIUM 40 MG PO TBEC: 40.0000 mg | DELAYED_RELEASE_TABLET | Freq: Every day | ORAL | Status: DC

## 2020-05-02 MED ORDER — PROPOFOL 10 MG/ML IV BOLUS
INTRAVENOUS | Status: AC
  Filled 2020-05-02: qty 20

## 2020-05-02 MED ORDER — CHLORTHALIDONE 25 MG PO TABS
25.0000 mg | ORAL_TABLET | Freq: Every evening | ORAL | Status: DC
  Administered 2020-05-02: 25 mg via ORAL
  Filled 2020-05-02 (×2): qty 1

## 2020-05-02 MED ORDER — DEXAMETHASONE SODIUM PHOSPHATE 10 MG/ML IJ SOLN
INTRAMUSCULAR | Status: DC | PRN
  Administered 2020-05-02: 4 mg via INTRAVENOUS

## 2020-05-02 MED ORDER — ACETAMINOPHEN 325 MG PO TABS: 650.0000 mg | ORAL_TABLET | ORAL | Status: DC | PRN

## 2020-05-02 MED ORDER — LEVOTHYROXINE SODIUM 25 MCG PO TABS
50.0000 ug | ORAL_TABLET | Freq: Every day | ORAL | Status: DC
  Administered 2020-05-03: 50 ug via ORAL
  Filled 2020-05-02: qty 2

## 2020-05-02 MED ORDER — ROCURONIUM BROMIDE 10 MG/ML (PF) SYRINGE
PREFILLED_SYRINGE | INTRAVENOUS | Status: DC | PRN
  Administered 2020-05-02: 50 mg via INTRAVENOUS

## 2020-05-02 MED ORDER — EPINEPHRINE PF 1 MG/ML IJ SOLN
INTRAMUSCULAR | Status: AC
  Filled 2020-05-02: qty 1

## 2020-05-02 MED ORDER — FENTANYL CITRATE (PF) 250 MCG/5ML IJ SOLN
INTRAMUSCULAR | Status: AC
  Filled 2020-05-02: qty 5

## 2020-05-02 MED ORDER — LIDOCAINE 2% (20 MG/ML) 5 ML SYRINGE
INTRAMUSCULAR | Status: DC | PRN
  Administered 2020-05-02: 60 mg via INTRAVENOUS

## 2020-05-02 MED ORDER — METHOCARBAMOL 500 MG PO TABS: 500.0000 mg | ORAL_TABLET | Freq: Three times a day (TID) | ORAL | 0 refills | Status: AC | PRN

## 2020-05-02 MED ORDER — HYDROMORPHONE HCL 1 MG/ML IJ SOLN
0.2500 mg | INTRAMUSCULAR | Status: DC | PRN
  Administered 2020-05-02: 0.25 mg via INTRAVENOUS
  Administered 2020-05-02: 0.5 mg via INTRAVENOUS

## 2020-05-02 MED ORDER — OMEPRAZOLE MAGNESIUM 20 MG PO TBEC: 20.0000 mg | DELAYED_RELEASE_TABLET | Freq: Every day | ORAL | Status: DC

## 2020-05-02 MED ORDER — ACETAMINOPHEN 10 MG/ML IV SOLN
INTRAVENOUS | Status: DC | PRN
Start: 2020-05-02 — End: 2020-05-02
  Administered 2020-05-02: 1000 mg via INTRAVENOUS

## 2020-05-02 MED ORDER — ESCITALOPRAM OXALATE 20 MG PO TABS
20.0000 mg | ORAL_TABLET | Freq: Every day | ORAL | Status: DC
  Administered 2020-05-03: 20 mg via ORAL
  Filled 2020-05-02: qty 1

## 2020-05-02 MED ORDER — POLYETHYL GLYCOL-PROPYL GLYCOL 0.4-0.3 % OP GEL: Freq: Two times a day (BID) | OPHTHALMIC | Status: DC

## 2020-05-02 MED ORDER — ACETAMINOPHEN 10 MG/ML IV SOLN
INTRAVENOUS | Status: AC
  Filled 2020-05-02: qty 100

## 2020-05-02 MED ORDER — MIDAZOLAM HCL 2 MG/2ML IJ SOLN
INTRAMUSCULAR | Status: AC
  Filled 2020-05-02: qty 2

## 2020-05-02 MED ORDER — INSULIN ASPART 100 UNIT/ML ~~LOC~~ SOLN: 0.0000 [IU] | Freq: Every day | SUBCUTANEOUS | Status: DC

## 2020-05-02 MED ORDER — EPINEPHRINE PF 1 MG/ML IJ SOLN
INTRAMUSCULAR | Status: DC | PRN
  Administered 2020-05-02: 1 mg

## 2020-05-02 MED ORDER — FERROUS SULFATE 325 (65 FE) MG PO TABS
325.0000 mg | ORAL_TABLET | Freq: Every day | ORAL | Status: DC
  Administered 2020-05-03: 325 mg via ORAL
  Filled 2020-05-02: qty 1

## 2020-05-02 MED ORDER — THROMBIN (RECOMBINANT) 20000 UNITS EX SOLR
CUTANEOUS | Status: AC
  Filled 2020-05-02: qty 20000

## 2020-05-02 MED ORDER — DEXAMETHASONE SODIUM PHOSPHATE 10 MG/ML IJ SOLN
INTRAMUSCULAR | Status: AC
  Filled 2020-05-02: qty 1

## 2020-05-02 MED ORDER — IRBESARTAN 75 MG PO TABS
75.0000 mg | ORAL_TABLET | Freq: Every day | ORAL | Status: DC
  Filled 2020-05-02: qty 1

## 2020-05-02 MED ORDER — OXYCODONE HCL 5 MG PO TABS: 5.0000 mg | ORAL_TABLET | ORAL | Status: DC | PRN

## 2020-05-02 MED ORDER — MORPHINE SULFATE (PF) 2 MG/ML IV SOLN
2.0000 mg | INTRAVENOUS | Status: DC | PRN
  Administered 2020-05-02: 2 mg via INTRAVENOUS
  Filled 2020-05-02: qty 1

## 2020-05-02 MED ORDER — ONDANSETRON HCL 4 MG/2ML IJ SOLN: 4.0000 mg | Freq: Four times a day (QID) | INTRAMUSCULAR | Status: DC | PRN

## 2020-05-02 MED ORDER — CEFAZOLIN SODIUM-DEXTROSE 2-4 GM/100ML-% IV SOLN
2.0000 g | INTRAVENOUS | Status: AC
  Administered 2020-05-02: 2 g via INTRAVENOUS
  Filled 2020-05-02: qty 100

## 2020-05-02 MED ORDER — ORAL CARE MOUTH RINSE: 15.0000 mL | Freq: Once | OROMUCOSAL | Status: AC

## 2020-05-02 MED ORDER — METFORMIN HCL 500 MG PO TABS
500.0000 mg | ORAL_TABLET | Freq: Every day | ORAL | Status: DC
  Administered 2020-05-02: 500 mg via ORAL
  Filled 2020-05-02: qty 1

## 2020-05-02 MED ORDER — BUPIVACAINE HCL (PF) 0.25 % IJ SOLN
INTRAMUSCULAR | Status: AC
  Filled 2020-05-02: qty 30

## 2020-05-02 MED ORDER — MENTHOL 3 MG MT LOZG: 1.0000 | LOZENGE | OROMUCOSAL | Status: DC | PRN

## 2020-05-02 MED ORDER — ONDANSETRON HCL 4 MG/2ML IJ SOLN
INTRAMUSCULAR | Status: DC | PRN
  Administered 2020-05-02: 4 mg via INTRAVENOUS

## 2020-05-02 MED ORDER — METHOCARBAMOL 500 MG PO TABS
500.0000 mg | ORAL_TABLET | Freq: Four times a day (QID) | ORAL | Status: DC | PRN
  Administered 2020-05-02 – 2020-05-03 (×3): 500 mg via ORAL
  Filled 2020-05-02 (×2): qty 1

## 2020-05-02 MED ORDER — CEFAZOLIN SODIUM-DEXTROSE 1-4 GM/50ML-% IV SOLN
1.0000 g | Freq: Three times a day (TID) | INTRAVENOUS | Status: AC
  Administered 2020-05-02 – 2020-05-03 (×2): 1 g via INTRAVENOUS
  Filled 2020-05-02 (×2): qty 50

## 2020-05-02 MED ORDER — ACETAMINOPHEN 650 MG RE SUPP: 650.0000 mg | RECTAL | Status: DC | PRN

## 2020-05-02 MED ORDER — FENTANYL CITRATE (PF) 250 MCG/5ML IJ SOLN
INTRAMUSCULAR | Status: DC | PRN
  Administered 2020-05-02 (×2): 50 ug via INTRAVENOUS

## 2020-05-02 MED ORDER — PHENOL 1.4 % MT LIQD: 1.0000 | OROMUCOSAL | Status: DC | PRN

## 2020-05-02 MED ORDER — CARVEDILOL 12.5 MG PO TABS
12.5000 mg | ORAL_TABLET | Freq: Two times a day (BID) | ORAL | Status: DC
  Administered 2020-05-02: 12.5 mg via ORAL
  Filled 2020-05-02: qty 1

## 2020-05-02 MED ORDER — ONDANSETRON HCL 4 MG PO TABS: 4.0000 mg | ORAL_TABLET | Freq: Four times a day (QID) | ORAL | Status: DC | PRN

## 2020-05-02 MED ORDER — FLEET ENEMA 7-19 GM/118ML RE ENEM: 1.0000 | ENEMA | Freq: Once | RECTAL | Status: DC | PRN

## 2020-05-02 MED ORDER — LIDOCAINE 2% (20 MG/ML) 5 ML SYRINGE
INTRAMUSCULAR | Status: AC
  Filled 2020-05-02: qty 5

## 2020-05-02 MED ORDER — METHOCARBAMOL 1000 MG/10ML IJ SOLN
500.0000 mg | Freq: Four times a day (QID) | INTRAVENOUS | Status: DC | PRN
  Filled 2020-05-02: qty 5

## 2020-05-02 MED ORDER — SODIUM CHLORIDE 0.9% FLUSH
3.0000 mL | Freq: Two times a day (BID) | INTRAVENOUS | Status: DC
  Administered 2020-05-02: 3 mL via INTRAVENOUS

## 2020-05-02 MED ORDER — LACTATED RINGERS IV SOLN: INTRAVENOUS | Status: DC

## 2020-05-02 MED ORDER — BUPIVACAINE HCL (PF) 0.25 % IJ SOLN
INTRAMUSCULAR | Status: DC | PRN
  Administered 2020-05-02: 20 mL

## 2020-05-02 MED ORDER — THROMBIN 20000 UNITS EX SOLR
CUTANEOUS | Status: DC | PRN
  Administered 2020-05-02: 20 mL via TOPICAL

## 2020-05-02 MED ORDER — ATORVASTATIN CALCIUM 10 MG PO TABS
20.0000 mg | ORAL_TABLET | Freq: Every day | ORAL | Status: DC
  Administered 2020-05-02: 20 mg via ORAL
  Filled 2020-05-02: qty 2

## 2020-05-02 MED ORDER — OXYCODONE-ACETAMINOPHEN 10-325 MG PO TABS: 1.0000 | ORAL_TABLET | Freq: Four times a day (QID) | ORAL | 0 refills | Status: AC | PRN

## 2020-05-02 MED ORDER — LACTATED RINGERS IV SOLN: INTRAVENOUS | Status: DC | PRN

## 2020-05-02 MED ORDER — 0.9 % SODIUM CHLORIDE (POUR BTL) OPTIME
TOPICAL | Status: DC | PRN
  Administered 2020-05-02 (×2): 1000 mL

## 2020-05-02 MED ORDER — HYDROMORPHONE HCL 1 MG/ML IJ SOLN
INTRAMUSCULAR | Status: AC
  Filled 2020-05-02: qty 1

## 2020-05-02 MED ORDER — DOCUSATE SODIUM 100 MG PO CAPS
100.0000 mg | ORAL_CAPSULE | Freq: Two times a day (BID) | ORAL | Status: DC
  Administered 2020-05-02: 100 mg via ORAL
  Filled 2020-05-02: qty 1

## 2020-05-02 SURGICAL SUPPLY — 60 items
CANISTER SUCT 3000ML PPV (MISCELLANEOUS) ×3 IMPLANT
CLOSURE STERI-STRIP 1/2X4 (GAUZE/BANDAGES/DRESSINGS) ×1
CLSR STERI-STRIP ANTIMIC 1/2X4 (GAUZE/BANDAGES/DRESSINGS) ×2 IMPLANT
COVER MAYO STAND STRL (DRAPES) ×3 IMPLANT
COVER PROBE W GEL 5X96 (DRAPES) IMPLANT
COVER SURGICAL LIGHT HANDLE (MISCELLANEOUS) ×3 IMPLANT
COVER WAND RF STERILE (DRAPES) IMPLANT
DRAPE C-ARM 42X72 X-RAY (DRAPES) ×3 IMPLANT
DRAPE SURG 17X23 STRL (DRAPES) ×3 IMPLANT
DRAPE U-SHAPE 47X51 STRL (DRAPES) ×3 IMPLANT
DRSG OPSITE POSTOP 4X6 (GAUZE/BANDAGES/DRESSINGS) ×6 IMPLANT
DURAPREP 26ML APPLICATOR (WOUND CARE) ×3 IMPLANT
ELECT BLADE 4.0 EZ CLEAN MEGAD (MISCELLANEOUS)
ELECT CAUTERY BLADE 6.4 (BLADE) IMPLANT
ELECT PENCIL ROCKER SW 15FT (MISCELLANEOUS) ×3 IMPLANT
ELECT REM PT RETURN 9FT ADLT (ELECTROSURGICAL) ×3
ELECTRODE BLDE 4.0 EZ CLN MEGD (MISCELLANEOUS) IMPLANT
ELECTRODE REM PT RTRN 9FT ADLT (ELECTROSURGICAL) ×1 IMPLANT
GENERATOR PULSE PROCLAIM 5ELIT (Neuro Prosthesis/Implant) ×1 IMPLANT
GLOVE BIO SURGEON STRL SZ7 (GLOVE) ×3 IMPLANT
GLOVE BIOGEL PI IND STRL 7.0 (GLOVE) ×1 IMPLANT
GLOVE BIOGEL PI IND STRL 8.5 (GLOVE) ×1 IMPLANT
GLOVE BIOGEL PI INDICATOR 7.0 (GLOVE) ×2
GLOVE BIOGEL PI INDICATOR 8.5 (GLOVE) ×2
GLOVE SS N UNI LF 8.5 STRL (GLOVE) ×3 IMPLANT
GOWN STRL REUS W/ TWL LRG LVL3 (GOWN DISPOSABLE) ×2 IMPLANT
GOWN STRL REUS W/TWL 2XL LVL3 (GOWN DISPOSABLE) ×3 IMPLANT
GOWN STRL REUS W/TWL LRG LVL3 (GOWN DISPOSABLE) ×4
KIT BASIN OR (CUSTOM PROCEDURE TRAY) ×3 IMPLANT
KIT TURNOVER KIT B (KITS) ×3 IMPLANT
LEAD LAMITRODE 60CM 8CH (Lead) ×6 IMPLANT
LEAD OCTRODE GEN 8CH 60CM (Lead) ×3 IMPLANT
NEEDLE 22X1 1/2 (OR ONLY) (NEEDLE) ×3 IMPLANT
NEEDLE SPNL 18GX3.5 QUINCKE PK (NEEDLE) ×3 IMPLANT
NS IRRIG 1000ML POUR BTL (IV SOLUTION) ×3 IMPLANT
PACK LAMINECTOMY ORTHO (CUSTOM PROCEDURE TRAY) ×3 IMPLANT
PACK UNIVERSAL I (CUSTOM PROCEDURE TRAY) ×3 IMPLANT
PAD ARMBOARD 7.5X6 YLW CONV (MISCELLANEOUS) ×9 IMPLANT
PROGRAMMER DBS W/MAGNET NMRI (MISCELLANEOUS) ×3 IMPLANT
PULSE GENERATOR PROCLAIM 5ELIT (Neuro Prosthesis/Implant) ×3 IMPLANT
SPATULA SILICONE BRAIN 10MM (MISCELLANEOUS) ×3 IMPLANT
SPONGE LAP 4X18 RFD (DISPOSABLE) IMPLANT
SPONGE SURGIFOAM ABS GEL 100 (HEMOSTASIS) ×3 IMPLANT
STAPLER VISISTAT 35W (STAPLE) IMPLANT
SURGIFLO W/THROMBIN 8M KIT (HEMOSTASIS) ×6 IMPLANT
SUT BONE WAX W31G (SUTURE) ×3 IMPLANT
SUT ETHIBOND 2 OS 4 DA (SUTURE) ×3 IMPLANT
SUT MNCRL AB 3-0 PS2 18 (SUTURE) ×6 IMPLANT
SUT VIC AB 1 CT1 18XCR BRD 8 (SUTURE) ×2 IMPLANT
SUT VIC AB 1 CT1 8-18 (SUTURE) ×4
SUT VIC AB 2-0 CT1 18 (SUTURE) ×3 IMPLANT
SUT VIC AB 2-0 CT1 27 (SUTURE) ×2
SUT VIC AB 2-0 CT1 TAPERPNT 27 (SUTURE) ×1 IMPLANT
SYR BULB IRRIG 60ML STRL (SYRINGE) ×3 IMPLANT
SYR CONTROL 10ML LL (SYRINGE) ×3 IMPLANT
TOOL TUNNELING 20 (MISCELLANEOUS) ×3 IMPLANT
TOWEL GREEN STERILE (TOWEL DISPOSABLE) ×3 IMPLANT
TOWEL GREEN STERILE FF (TOWEL DISPOSABLE) ×3 IMPLANT
WATER STERILE IRR 1000ML POUR (IV SOLUTION) ×3 IMPLANT
YANKAUER SUCT BULB TIP NO VENT (SUCTIONS) ×3 IMPLANT

## 2020-05-02 NOTE — Anesthesia Procedure Notes (Signed)
Procedure Name: Intubation Date/Time: 05/02/2020 10:31 AM Performed by: Michele Rockers, CRNA Pre-anesthesia Checklist: Patient identified, Emergency Drugs available, Suction available and Patient being monitored Patient Re-evaluated:Patient Re-evaluated prior to induction Oxygen Delivery Method: Circle system utilized Preoxygenation: Pre-oxygenation with 100% oxygen Induction Type: IV induction Ventilation: Mask ventilation without difficulty Laryngoscope Size: Miller and 2 Grade View: Grade I Tube type: Oral Tube size: 7.5 mm Number of attempts: 1 Airway Equipment and Method: Stylet and Oral airway Placement Confirmation: ETT inserted through vocal cords under direct vision,  positive ETCO2 and breath sounds checked- equal and bilateral Secured at: 21 cm Tube secured with: Tape Dental Injury: Teeth and Oropharynx as per pre-operative assessment

## 2020-05-02 NOTE — Op Note (Signed)
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  Postoperative diagnosis: Same  Operative procedure: Implantation of spinal cord stimulator  Condition: Stable  First Assistant: Cleta Alberts, PA  Implant: Abbott spine: S8 lamitrode.  Proclaim V nonrechargeable battery  Intraoperative findings: Unable to place second S8 lead or percutaneously despite multiple attempts.  Single S8 lead spanned T8 -T10.  Per the [employer] representative the final lead placement.  Spanned the appropriate area that was being covered during the trial.  Indications: Heidi Thompson is a pleasant 74 year old man who had a lumbar spinal fusion for severe debilitating back pain.  While she did note improvement from this she still had underlying pain that was affecting her quality of life.  She ultimately had a trial of spinal cord stimulator placement and noted improvement in her quality of life and pain.  As result of the successful trial we elected to move forward with a permanent implant.  All appropriate risks benefits and alternatives to surgery were discussed with the patient and consent was obtained.  Operative note: Patient was brought the operating room placed supine the operating table.  After successful induction of general anesthesia and endotracheal intubation teds and SCDs were applied and the patient was turned prone onto the Wilson frame.  All bony prominences well-padded and the thoracolumbar spine was prepped and draped in a standard fashion.  Timeout was taken to confirm patient procedure and all other important data.  Using fluoroscopy identified the T10 pedicle and marked out my incision site I infiltrated this area with quarter percent Marcaine with epinephrine.  A midline incision was made starting from the inferior aspect of the T10 pedicle and proceeding down to the inferior aspect of the T11 spinous process.  Sharp dissection was carried out down to the  deep fascia.  Additional quarter percent Marcaine with epinephrine was injected into the paraspinal muscles in order to aid in postoperative analgesia as well as hemostasis.  I then gently stripped the paraspinal muscles to expose the T10 and T11 spinous process and lamina.  Self-retaining retractors were placed and I expanded them so I had excellent visualization of the posterior aspect of the dorsal spine.  Bipolar cautery was used to obtain hemostasis.  Using fluoroscopy I confirmed I was at the appropriate level and then remove the spinous process of T10.  I then used a fine curette to develop a plane underneath the T10 lamina and performed a laminotomy with a 2 mm Kerrison rongeur.  I then gently dissected through the ligamentum flavum with a Penfield 4 and used a 2 mm Kerrison rongeur to resect the ligamentum flavum and expose the dorsal surface of the thecal sac.  I then gently passed my [facility] elevator superiorly and I was able to easily pass underneath the lamina.  I then obtained the S8 paddle and gently passed this along the midline of the posterior aspect of the spine.  It was able to advance to the appropriate width at the T8 vertebral body level without incurring any resistance.  A second lead was obtained and despite multiple gentle passes it would never properly sit correctly.  It continues to float slightly lateral and ultimately I banded the attempt to place the second S8 lead.  I then obtained a percutaneous lead and attempted to place this slightly left of midline but again was unsuccessful.  At this point I confirmed with the representative that the initial S8 lead was adequately placed and she could  obtain the same coverage that she had during the trial.  I copiously irrigated the wound with normal saline and confirmed with AP and lateral x-rays that the single S8 lead was properly positioned.  There was no CSF leak noted.  At this point I secured the lead directly to the T11 spinous process  using #1 Ethibond suture.  I then passed the lead through the T11-12 interspinous process ligament so that I could wrap it around the T11 vertebral body.  I then made a second incision in the left gluteal region dissected down approximately 2-1/2 cm and created my pocket for the battery.  At this point before opening the battery I irrigated both wounds and used bipolar cautery and Bovie to obtain hemostasis.  At this point I used the submuscular passer to advance the lead from the thoracic wound to the battery site.  The lead was attached to the battery and the excess lead was coiled on the undersurface of the battery.  Battery was placed in the pocket and secured to the deep fascia with two #1 Vicryl sutures.  At this point with all of the implants properly positioned I tested the leads.  According to the representative the lead was working properly and there was no hardware issues.  Final x-rays were taken confirming that the paddle had not moved in both the AP and lateral planes.  Both wounds were again copiously irrigated with normal saline and made sure hemostasis using FloSeal this time.  After final irrigation I closed the deep fascia of both wounds with interrupted #1 Vicryl sutures.  I then closed superficial with 2-0 Vicryl sutures and a 3-0 Monocryl for the skin.  Steri-Strips and a dry dressing were applied and the patient was ultimately extubated transfer the PACU without incident.  The end of the case all needle and sponge counts were correct.  There were no adverse intraoperative events.

## 2020-05-02 NOTE — Brief Op Note (Signed)
05/02/2020  12:46 PM  PATIENT:  Heidi Thompson  74 y.o. female  PRE-OPERATIVE DIAGNOSIS:  Failed back syndrome  POST-OPERATIVE DIAGNOSIS:  Failed back syndrome  PROCEDURE:  Procedure(s) with comments: SPINAL CORD STIMULATOR INSERTION (N/A) - 2.5 hrs  SURGEON:  Surgeon(s) and Role:    Melina Schools, MD - Primary  PHYSICIAN ASSISTANT:   ASSISTANTS: Amanda Ward, PA   ANESTHESIA:   general  EBL:  30 mL   BLOOD ADMINISTERED:none  DRAINS: none   LOCAL MEDICATIONS USED:  MARCAINE     SPECIMEN:  No Specimen  DISPOSITION OF SPECIMEN:  N/A  COUNTS:  YES  TOURNIQUET:  * No tourniquets in log *  DICTATION: .Dragon Dictation  PLAN OF CARE: Admit for overnight observation  PATIENT DISPOSITION:  PACU - hemodynamically stable.

## 2020-05-02 NOTE — Discharge Instructions (Signed)
 Spinal Cord Stimulator Care After This sheet gives you information about how to care for yourself after your procedure. Your health care provider may also give you more specific instructions. If you have problems or questions, contact your health care provider. What can I expect after the procedure? After the procedure, it is common to have:  Soreness or pain.  Some swelling in the area where the hardware was removed.  A small amount of blood or clear fluid coming from your incision. Follow these instructions at home: If you have a cast:  Do not stick anything inside the cast to scratch your skin. Doing that increases your risk of infection.  Check the skin around the cast every day. Tell your health care provider about any concerns.  You may put lotion on dry skin around the edges of the cast. Do not put lotion on the skin underneath the cast.  Keep the cast clean and dry. Do not take baths, swim, or use a hot tub until your health care provider approves. Ask your health care provider if you may take showers. You may only be allowed to take sponge baths. 1. Keep the bandage (dressing) dry until your health care provider says it can be removed. 2. Ok to shower in 5 days.    Incision care  1. Follow instructions from your health care provider about how to take care of your incision. Make sure you: ? Wash your hands with soap and water before you change your dressing. If soap and water are not available, use hand sanitizer. ? Change your dressing as told by your health care provider. ? Leave stitches (sutures), skin glue, or adhesive strips in place. These skin closures may need to stay in place for 2 weeks or longer. If adhesive strip edges start to loosen and curl up, you may trim the loose edges. Do not remove adhesive strips completely unless your health care provider tells you to do that. 2. Check your incision area every day for signs of infection. Check for: ? Redness. ? More  swelling or pain. ? More fluid or blood. ? Warmth. ? Pus or a bad smell. Managing pain, stiffness, and swelling  1. If directed, put ice on the affected area: ? Put ice in a plastic bag. ? Place a towel between your skin and the bag. ? Leave the ice on for 20 minutes, 2-3 times a day. 2. Move your fingers or toes often to avoid stiffness and to lessen swelling.  Driving  Do not drive or use heavy machinery while taking prescription pain medicine.  Do not drive for 24 hours if you were given a medicine to help you relax (sedative) during your procedure.  Ask your health care provider when it is safe to drive if you have a cast, splint, or boot on the affected limb. Activity  Ask your health care provider what activities are safe for you during recovery, and ask what activities you need to avoid.  Do not use the injured limb to support your body weight until your health care provider says that you can.  Do not play contact sports until your health care provider approves.  Do exercises as told by your health care provider.  Avoid sitting for a long time without moving. Get up and move around at least every few hours. This will help prevent blood clots. General instructions 1. Do not put pressure on any part of the cast or splint until it is fully hardened. This   may take several hours. 2. If you are taking prescription pain medicine, take actions to prevent or treat constipation. Your health care provider may recommend that you: ? Drink enough fluid to keep your urine pale yellow. ? Eat foods that are high in fiber, such as fresh fruits and vegetables, whole grains, and beans. ? Limit foods that are high in fat and processed sugars, such as fried or sweet foods. ? Take an over-the-counter or prescription medicine for constipation. 3. Do not use any products that contain nicotine or tobacco, such as cigarettes and e-cigarettes. These can delay bone healing after surgery. If you need  help quitting, ask your health care provider. 4. Take over-the-counter and prescription medicines only as told by your health care provider. 5. Keep all follow-up visits as told by your health care provider. This is important. Contact a health care provider if:  You have lasting pain.  You have redness around your incision.  You have more swelling or pain around your incision.  You have more fluid or blood coming from your incision.  Your incision feels warm to the touch.  You have pus or a bad smell coming from your incision.  You are unable to do exercises or physical activity as told by your health care provider. Get help right away if:  You have difficulty breathing.  You have chest pain.  You have severe pain.  You have a fever or chills.  You have numbness for more than 24 hours in the area where the hardware was removed. Summary  After the procedure, it is common to have some pain and swelling in the area where the hardware was removed.  Follow instructions from your health care provider about how to take care of your incision.  Return to your normal activities as told by your health care provider. Ask your health care provider what activities are safe for you. This information is not intended to replace advice given to you by your health care provider. Make sure you discuss any questions you have with your health care provider. 

## 2020-05-02 NOTE — Transfer of Care (Signed)
Immediate Anesthesia Transfer of Care Note  Patient: Heidi Thompson  Procedure(s) Performed: SPINAL CORD STIMULATOR INSERTION (N/A )  Patient Location: PACU  Anesthesia Type:General  Level of Consciousness: drowsy and patient cooperative  Airway & Oxygen Therapy: Patient Spontanous Breathing and Patient connected to face mask oxygen  Post-op Assessment: Report given to RN and Post -op Vital signs reviewed and stable  Post vital signs: Reviewed and stable  Last Vitals:  Vitals Value Taken Time  BP 170/77 05/02/20 1320  Temp 36.2 C 05/02/20 1320  Pulse 67 05/02/20 1325  Resp 8 05/02/20 1325  SpO2 100 % 05/02/20 1325  Vitals shown include unvalidated device data.  Last Pain:  Vitals:   05/02/20 0836  TempSrc: Temporal  PainSc:       Patients Stated Pain Goal: 3 (62/86/38 [id number])  Complications: No complications documented.

## 2020-05-02 NOTE — H&P (Signed)
There has been no change in the patient's physical exam or history since her last office visit of 04/27/2020.  Patient has ongoing significant back and radicular leg pain.  She has had an extensive lumbar spinal fusion and unfortunately her quality of life is remain poor.  She had a recent spinal cord stimulator trial and had significant temporary improvement.  As result of the success of the trial we elected to move forward with the permanent spinal cord stimulator implantation.  I reviewed the risks and benefits of surgery with the patient in great detail and all of her questions were addressed.  We will plan on moving forward with the spinal cord stimulator placement today.

## 2020-05-02 NOTE — Anesthesia Postprocedure Evaluation (Signed)
Anesthesia Post Note  Patient: Heidi Thompson  Procedure(s) Performed: SPINAL CORD STIMULATOR INSERTION (N/A )     Patient location during evaluation: PACU Anesthesia Type: General Level of consciousness: awake and alert Pain management: pain level controlled Vital Signs Assessment: post-procedure vital signs reviewed and stable Respiratory status: spontaneous breathing, nonlabored ventilation and respiratory function stable Cardiovascular status: blood pressure returned to baseline and stable Postop Assessment: no apparent nausea or vomiting Anesthetic complications: no   No complications documented.  Last Vitals:  Vitals:   05/02/20 1405 05/02/20 1415  BP: (!) 172/68   Pulse: 61 62  Resp: 14 15  Temp:    SpO2: 97% 99%    Last Pain:  Vitals:   05/02/20 1342  TempSrc:   PainSc: 10-Worst pain ever                 Clayborne Divis,W. EDMOND

## 2020-05-03 ENCOUNTER — Encounter (HOSPITAL_COMMUNITY): Payer: Self-pay | Admitting: Orthopedic Surgery

## 2020-05-03 DIAGNOSIS — M961 Postlaminectomy syndrome, not elsewhere classified: Secondary | ICD-10-CM | POA: Diagnosis not present

## 2020-05-03 LAB — GLUCOSE, CAPILLARY: Glucose-Capillary: 163 mg/dL — ABNORMAL HIGH (ref 70–99)

## 2020-05-03 MED FILL — Thrombin (Recombinant) For Soln 20000 Unit: CUTANEOUS | Qty: 1 | Status: AC

## 2020-05-03 NOTE — Progress Notes (Signed)
Patient is discharged from room 3C08 at this time. Alert and in stable condition. IV site d/c'd and instructions read to patient and daughter with all questions answered and understanding verbalized. Left unit via wheelchair with all belongings at side.

## 2020-05-03 NOTE — Progress Notes (Signed)
    Subjective: Procedure(s) (LRB): SPINAL CORD STIMULATOR INSERTION (N/A) 1 Day Post-Op  Patient reports pain as 3 on 0-10 scale.  Reports decreased leg pain reports incisional back pain   Positive void Negative bowel movement Positive flatus Negative chest pain or shortness of breath  Objective: Vital signs in last 24 hours: Temp:  [97.2 F (36.2 C)-98.7 F (37.1 C)] 98.3 F (36.8 C) (07/01 0738) Pulse Rate:  [61-70] 68 (07/01 0738) Resp:  [8-18] 16 (07/01 0738) BP: (128-182)/(61-77) 128/69 (07/01 0738) SpO2:  [93 %-100 %] 99 % (07/01 0738)  Intake/Output from previous day: 06/30 0701 - 07/01 0700 In: 1050 [I.V.:1000; IV Piggyback:50] Out: 30 [Blood:30]  Labs: No results for input(s): WBC, RBC, HCT, PLT in the last 72 hours. No results for input(s): NA, K, CL, CO2, BUN, CREATININE, GLUCOSE, CALCIUM in the last 72 hours. No results for input(s): LABPT, INR in the last 72 hours.  Physical Exam: Neurologically intact ABD soft Intact pulses distally Incision: dressing C/D/I and no drainage Compartment soft Body mass index is 30.21 kg/m.   Assessment/Plan: Patient stable  xrays n/a Continue mobilization with physical therapy Continue care  Advance diet Up with therapy  Ok for d/c to home.    Melina Schools, MD [facility] [phone]

## 2020-05-09 ENCOUNTER — Ambulatory Visit: Payer: Medicare Other | Admitting: Psychology

## 2020-05-10 NOTE — Discharge Summary (Signed)
Patient ID: Heidi Thompson MRN: [id number] DOB/AGE: [birth year] 74 y.o.  Admit date: 05/02/2020 Discharge date: 05/10/2020  Admission Diagnoses:  Active Problems:   Chronic pain   Discharge Diagnoses:  Active Problems:   Chronic pain  status post Procedure(s): SPINAL CORD STIMULATOR INSERTION  Past Medical History:  Diagnosis Date  . Acid reflux   . Anxiety   . Arthritis    spondylotic radiculopathy, neck, back, shoulder   . Cancer ([place]) 1978   melanoma- on R leg, removed  . Diabetes mellitus without complication ([place])   . H/O total thyroidectomy 1982  . Heart murmur   . History of kidney stones    on imaging, saw 2 stones in kidney & pt. reports that she thinks she has passed one on her own at one time.   . Hypertension   . PONV (postoperative nausea and vomiting)     Surgeries: Procedure(s): SPINAL CORD STIMULATOR INSERTION on 05/02/2020   Consultants:   Discharged Condition: Improved  Hospital Course: Heidi Thompson is an 74 y.o. 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 Patient failed conservative treatments (please see the history and physical for the specifics) and had severe unremitting pain that affects sleep, daily activities and work/hobbies. After pre-op clearance, the patient was taken to the operating room on 05/02/2020 and underwent  Procedure(s): [place].    Patient was given perioperative antibiotics:  Anti-infectives (From admission, onward)   Start     Dose/Rate Route Frequency Ordered Stop   05/02/20 1830  ceFAZolin (ANCEF) IVPB 1 g/50 mL premix        1 g 100 mL/hr over 30 Minutes Intravenous Every 8 hours 05/02/20 1439 05/03/20 0330   05/02/20 0809  ceFAZolin (ANCEF) IVPB 2g/100 mL premix        2 g 200 mL/hr over 30 Minutes Intravenous 30 min pre-op 05/02/20 0809 05/02/20 1045       Patient was given sequential compression devices and  early ambulation to prevent DVT.   Patient benefited maximally from hospital stay and there were no complications. At the time of discharge, the patient was urinating/moving their bowels without difficulty, tolerating a regular diet, pain is controlled with oral pain medications and they have been cleared by PT/OT.   Recent vital signs: No data found.   Recent laboratory studies: No results for input(s): WBC, HGB, HCT, PLT, NA, K, CL, CO2, BUN, CREATININE, GLUCOSE, INR, CALCIUM in the last 72 hours.  Invalid input(s): PT, 2   Discharge Medications:   Allergies as of 05/03/2020      Reactions   Azithromycin    Stomach Pain      Medication List    TAKE these medications   atorvastatin 20 MG tablet Commonly known as: LIPITOR Take 20 mg by mouth at bedtime.   carvedilol 25 MG tablet Commonly known as: COREG Take 12.5 mg by mouth 2 (two) times daily.   chlorthalidone 25 MG tablet Commonly known as: HYGROTON Take 25 mg by mouth every evening.   escitalopram 20 MG tablet Commonly known as: LEXAPRO Take 20 mg by mouth daily before breakfast.   ferrous sulfate 325 (65 FE) MG tablet Take 325 mg by mouth daily with breakfast.   levothyroxine 50 MCG tablet Commonly known as: SYNTHROID Take 50 mcg by mouth daily before breakfast. 2 hours after breakfast   LORazepam 1 MG tablet Commonly known as: ATIVAN Take 1 mg by  mouth 2 (two) times daily.   metFORMIN 500 MG tablet Commonly known as: GLUCOPHAGE Take 500 mg by mouth every evening.   multivitamin with minerals tablet Take 1 tablet by mouth daily.   omeprazole 20 MG tablet Commonly known as: PRILOSEC OTC Take 20 mg by mouth daily before breakfast.   ondansetron 4 MG tablet Commonly known as: Zofran Take 1 tablet (4 mg total) by mouth every 8 (eight) hours as needed for nausea or vomiting.   potassium chloride SA 20 MEQ tablet Commonly known as: KLOR-CON Take 20 mEq by mouth daily with lunch.   SYSTANE OP Place 1  drop into both eyes 2 (two) times daily.   valsartan 80 MG tablet Commonly known as: DIOVAN Take 80 mg by mouth daily.   Vitamin D3 50 MCG (2000 UT) Tabs Take 2,000 Units by mouth daily.     ASK your doctor about these medications   methocarbamol 500 MG tablet Commonly known as: Robaxin Take 1 tablet (500 mg total) by mouth every 8 (eight) hours as needed for up to 5 days for muscle spasms. Ask about: Should I take this medication?   oxyCODONE-acetaminophen 10-325 MG tablet Commonly known as: Percocet Take 1 tablet by mouth every 6 (six) hours as needed for up to 5 days for pain. Ask about: Should I take this medication?       Diagnostic Studies: DG Chest 2 View  Result Date: 04/28/2020 CLINICAL DATA:  Pre-admit EXAM: CHEST - 2 VIEW COMPARISON:  None. FINDINGS: The heart size and mediastinal contours are within normal limits. Both lungs are clear. Disc degenerative disease of the thoracic spine. Partially imaged cervical and lumbar fusion hardware. IMPRESSION: No acute abnormality of the lungs. Electronically Signed   By: Eddie Candle M.D.   On: 04/28/2020 10:55   DG Thoracolumabar Spine  Result Date: 05/02/2020 CLINICAL DATA:  Spinal cord stimulator placement EXAM: DG C-ARM 1-60 MIN; THORACOLUMBAR SPINE - 2 VIEW COMPARISON:  None. FLUOROSCOPY TIME:  Fluoroscopy Time:  1 minutes 2 seconds Radiation Exposure Index (if provided by the fluoroscopic device): 46.29 mGy Number of Acquired Spot Images: 3 FINDINGS: Initial images demonstrate a wire extending along the posterior soft tissues of the back with spine stimulator lead extending into the lower thoracic spine. IMPRESSION: Imaging for spinal stimulator placement over the lower thoracic spine. Electronically Signed   By: Inez Catalina M.D.   On: 05/02/2020 13:54   DG C-Arm 1-60 Min  Result Date: 05/02/2020 CLINICAL DATA:  Spinal cord stimulator placement EXAM: DG C-ARM 1-60 MIN; THORACOLUMBAR SPINE - 2 VIEW COMPARISON:  None.  FLUOROSCOPY TIME:  Fluoroscopy Time:  1 minutes 2 seconds Radiation Exposure Index (if provided by the fluoroscopic device): 46.29 mGy Number of Acquired Spot Images: 3 FINDINGS: Initial images demonstrate a wire extending along the posterior soft tissues of the back with spine stimulator lead extending into the lower thoracic spine. IMPRESSION: Imaging for spinal stimulator placement over the lower thoracic spine. Electronically Signed   By: Inez Catalina M.D.   On: 05/02/2020 13:54    Discharge Instructions    Incentive spirometry RT   Complete by: As directed        Follow-up Information    Melina Schools, MD. Schedule an appointment as soon as possible for a visit in 2 weeks.   Specialty: Orthopedic Surgery Why: If symptoms worsen, For suture removal, For wound re-check Contact information: [address] [place] [address] [phone]  Discharge Plan:  discharge to home  Disposition: stable    Signed: Yvonne Kendall Amethyst Gainer for [facility] PA-C [facility] [phone] 05/10/2020, 7:48 AM

## 2020-12-13 ENCOUNTER — Encounter: Payer: Self-pay | Admitting: Internal Medicine

## 2021-01-25 ENCOUNTER — Encounter: Payer: Self-pay | Admitting: Gastroenterology

## 2021-01-25 ENCOUNTER — Other Ambulatory Visit: Payer: Self-pay

## 2021-01-25 ENCOUNTER — Ambulatory Visit (INDEPENDENT_AMBULATORY_CARE_PROVIDER_SITE_OTHER): Payer: Medicare Other | Admitting: Gastroenterology

## 2021-01-25 VITALS — BP 133/60 | HR 59 | Temp 97.3°F | Ht 63.0 in | Wt 165.6 lb

## 2021-01-25 DIAGNOSIS — Z8379 Family history of other diseases of the digestive system: Secondary | ICD-10-CM | POA: Insufficient documentation

## 2021-01-25 DIAGNOSIS — K529 Noninfective gastroenteritis and colitis, unspecified: Secondary | ICD-10-CM | POA: Diagnosis not present

## 2021-01-25 DIAGNOSIS — K219 Gastro-esophageal reflux disease without esophagitis: Secondary | ICD-10-CM | POA: Diagnosis not present

## 2021-01-25 DIAGNOSIS — R1013 Epigastric pain: Secondary | ICD-10-CM | POA: Diagnosis not present

## 2021-01-25 MED ORDER — COLESTIPOL HCL 1 G PO TABS: ORAL_TABLET | ORAL | 1 refills | Status: DC

## 2021-01-25 NOTE — Progress Notes (Addendum)
Primary Care Physician:  [name], FNP  Primary Gastroenterologist:  Elon Alas. Abbey Chatters, DO   Chief Complaint  Patient presents with  . Diarrhea    Almost daily. Stopped Bentyl d/t didn't help. Has occ accidents. Sister had crohn's. Daughter has celiac. Has avoided gluten past 3 months but no better. Gallbladder has been removed.  . Abdominal Pain  . Bloated    HPI:  Heidi Thompson is a 75 y.o. female here at the request of [name], FNP, for further evaluation of diarrhea, abdominal pain/bloating.  Patient previously seen by gastroenterologist, Dr. Posey Pronto, in [place].  Patient requesting to transfer her care here.    Patient states she has had chronic diarrhea for years.  Over the last several months her symptoms have worsened.  Symptoms are associated with urgency and at times incontinence.  She is wearing a pull-up when she leaves her house and when she goes to bed at night.  Never goes a day without a bowel movement.  Rarely has a solid stool.  Generally 3-6 bowel movements per day. Often wakes up at night to have a BM.  Sometimes can take a couple hours to complete BMs prior to going back to bed.  Nocturnal diarrhea occurring for 3 months.  Denies blood in the stool.  Stools are somewhat dark.  Really denies any significant abdominal pain associated with her stools.  She has tried dicyclomine for 2 months with no improvement.  No improvement on Imodium.  She has put herself on a gluten-free diet for 3 months and no noted improvement in symptoms.  Her daughter has confirmed celiac.  Her daughter's daughter (granddaughter) suspected to have celiac disease.  Patient sister has Crohn's disease.  No family history of colon cancer.  Patient has a personal history of adenomatous colon polyps.   Patient also complains of new symptom of upper abdominal pain, burning/nausea.  Occurring for 2 months.  No vomiting.  No heartburn, dysphagia.  Denies aspirin or NSAIDs.  Has been on  omeprazole for years for chronic GERD/indigestion with adequate control.  No prior upper endoscopy.  Recent labs December 11, 2020: White blood cell count 6200, hemoglobin 12.9, hematocrit 39.8, MCV 92, platelets 319,000, albumin 3.9, total bilirubin 0.6, alkaline phosphatase 69, AST 31, ALT 35, TSH 1.51, A1c 5.5.  Stool studies 10/08/2020: Stool culture negative for Salmonella/Shigella/Campylobacter/E. coli.  C. difficile toxin gene NAA negative, O&P exam negative.  Colonoscopy with Dr. Posey Pronto December 15, 2017: Propofol, a few nonbleeding diverticula with medium openings seen in the sigmoid colon.  Normal mucosa was noted in the whole colon and cecum.  Report stated that random biopsies were taken to evaluate for microscopic colitis, "multiple cold forceps biopsies were performed for histology".  Single sessile polyp removed from the rectosigmoid junction.  Pathology showed rectosigmoid polyp was hyperplastic.  "Cecal biopsy" showed benign polypoid fragments of colonic mucosa with mild chronic inflammation, no adenomatous changes.  Current Outpatient Medications  Medication Sig Dispense Refill  . atorvastatin (LIPITOR) 20 MG tablet Take 20 mg by mouth at bedtime.     . carvedilol (COREG) 25 MG tablet Take 12.5 mg by mouth 2 (two) times daily.     . chlorthalidone (HYGROTON) 25 MG tablet Take 25 mg by mouth every evening.     . Cholecalciferol (VITAMIN D3) 2000 units TABS Take 2,000 Units by mouth daily.    Marland Kitchen escitalopram (LEXAPRO) 20 MG tablet Take 20 mg by mouth daily before breakfast.     . ferrous  sulfate 325 (65 FE) MG tablet Take 325 mg by mouth daily with breakfast.    . levothyroxine (SYNTHROID, LEVOTHROID) 50 MCG tablet Take 50 mcg by mouth daily before breakfast. 2 hours after breakfast    . LORazepam (ATIVAN) 1 MG tablet Take 1 mg by mouth 2 (two) times daily.    . metFORMIN (GLUCOPHAGE) 500 MG tablet Take 500 mg by mouth every evening.    . Multiple Vitamins-Minerals (MULTIVITAMIN WITH  MINERALS) tablet Take 1 tablet by mouth daily.    Marland Kitchen omeprazole (PRILOSEC OTC) 20 MG tablet Take 20 mg by mouth daily before breakfast.    . Polyethyl Glycol-Propyl Glycol (SYSTANE OP) Place 1 drop into both eyes 2 (two) times daily.     . potassium chloride SA (K-DUR,KLOR-CON) 20 MEQ tablet Take 20 mEq by mouth daily with lunch.     . valsartan (DIOVAN) 80 MG tablet Take 80 mg by mouth daily.     No current facility-administered medications for this visit.    Allergies as of 01/25/2021 - Review Complete 01/25/2021  Allergen Reaction Noted  . Azithromycin  04/23/2020    Past Medical History:  Diagnosis Date  . Acid reflux   . Anxiety   . Arthritis    spondylotic radiculopathy, neck, back, shoulder   . Cancer ([place]) 1978   melanoma- on R leg, removed  . Diabetes mellitus without complication ([place])   . H/O total thyroidectomy 1982  . Heart murmur   . History of kidney stones    on imaging, saw 2 stones in kidney & pt. reports that she thinks she has passed one on her own at one time.   . Hyperlipidemia   . Hypertension   . PONV (postoperative nausea and vomiting)     Past Surgical History:  Procedure Laterality Date  . ABDOMINAL HYSTERECTOMY    . ANTERIOR CERVICAL DECOMP/DISCECTOMY FUSION N/A 03/18/2017   Procedure: ACDF C4-7;  Surgeon: Melina Schools, MD;  Location: [facility];  Service: Orthopedics;  Laterality: N/A;  Requests 4 hours  . ANTERIOR LAT LUMBAR FUSION N/A 07/14/2018   Procedure: Anterior lateral lumbar fusion L2-4;  Surgeon: Melina Schools, MD;  Location: [facility];  Service: Orthopedics;  Laterality: N/A;  7 hrs  . APPENDECTOMY    . BACK SURGERY    . CHOLECYSTECTOMY    . EYE SURGERY Bilateral    cataracts /w IOL  . JOINT REPLACEMENT Bilateral    knees  . SHOULDER ARTHROSCOPY Right 2016  . SPINAL CORD STIMULATOR INSERTION N/A 05/02/2020   Procedure: SPINAL CORD STIMULATOR INSERTION;  Surgeon: Melina Schools, MD;  Location: [facility];  Service: Orthopedics;  Laterality: N/A;   2.5 hrs  . thumb joi    . thumb Joint  Right    thumb joint On R hand replaced  . TONSILLECTOMY    . TOTAL THYROIDECTOMY      Family History  Problem Relation Age of Onset  . Crohn's disease Sister   . Celiac disease Daughter   . Colon cancer Neg Hx     Social History   Socioeconomic History  . Marital status: Married    Spouse name: Not on file  . Number of children: Not on file  . Years of education: Not on file  . Highest education level: Not on file  Occupational History  . Not on file  Tobacco Use  . Smoking status: Never Smoker  . Smokeless tobacco: Never Used  Vaping Use  . Vaping Use: Never used  Substance and Sexual Activity  . Alcohol use: No  . Drug use: No  . Sexual activity: Not on file  Other Topics Concern  . Not on file  Social History Narrative  . Not on file   Social Determinants of Health   Financial Resource Strain: Not on file  Food Insecurity: Not on file  Transportation Needs: Not on file  Physical Activity: Not on file  Stress: Not on file  Social Connections: Not on file  Intimate Partner Violence: Not on file      ROS:  General: Negative for anorexia, weight loss, fever, chills, positive fatigue, weakness. Eyes: Negative for vision changes.  ENT: Negative for hoarseness, difficulty swallowing , nasal congestion. CV: Negative for chest pain, angina, palpitations, dyspnea on exertion, peripheral edema.  Respiratory: Negative for dyspnea at rest, dyspnea on exertion, cough, sputum, wheezing.  GI: See history of present illness. GU:  Negative for dysuria, hematuria, urinary incontinence, urinary frequency, nocturnal urination.  MS: Negative for joint pain, low back pain.  Derm: Negative for rash or itching.  Neuro: Negative for weakness, abnormal sensation, seizure, frequent headaches, memory loss, confusion.  Psych: Negative for anxiety, depression, suicidal ideation, hallucinations.  Endo: Negative for unusual weight change.   Heme: Negative for bruising or bleeding. Allergy: Negative for rash or hives.    Physical Examination:  BP 133/60   Pulse (!) 59   Temp (!) 97.3 F (36.3 C) (Temporal)   Ht 5\' 3"  (1.6 m)   Wt 165 lb 9.6 oz (75.1 kg)   BMI 29.33 kg/m    General: Well-nourished, well-developed in no acute distress.  Head: Normocephalic, atraumatic.   Eyes: Conjunctiva pink, no icterus. Mouth: masked. Neck: Supple without thyromegaly, masses, or lymphadenopathy.  Lungs: Clear to auscultation bilaterally.  Heart: Regular rate and rhythm, no murmurs rubs or gallops.  Abdomen: Bowel sounds are normal, nontender, nondistended, no hepatosplenomegaly or masses, no abdominal bruits or    hernia , no rebound or guarding.  Mild rectus diastases Rectal: not performed Extremities: No lower extremity edema. No clubbing or deformities.  Neuro: Alert and oriented x 4 , grossly normal neurologically.  Skin: Warm and dry, no rash or jaundice.   Psych: Alert and cooperative, normal mood and affect.  Labs: Lab Results  Component Value Date   CREATININE 0.98 04/27/2020   BUN 14 04/27/2020   NA 139 04/27/2020   K 3.9 04/27/2020   CL 101 04/27/2020   CO2 27 04/27/2020   No results found for: ALT, AST, GGT, ALKPHOS, BILITOT Lab Results  Component Value Date   WBC 7.5 04/27/2020   HGB 12.7 04/27/2020   HCT 38.8 04/27/2020   MCV 93.7 04/27/2020   PLT 281 04/27/2020   No results found for: ALT, AST, GGT, ALKPHOS, BILITOT Lab Results  Component Value Date   HGBA1C 6.0 (H) 04/27/2020     Imaging Studies: No results found.  Assessment:  Pleasant 75 year old female presenting for further evaluation of chronic diarrhea, epigastric pain/bloating.   Chronic diarrhea: Symptoms present for years.  Worsened over the last few months, now associated with nocturnal stools.  She has a lot of urgency and fecal incontinence that she cannot get to the bathroom in time.  Denies any significant abdominal pain.   Family history significant for Crohn's in his sister, daughter with celiac disease.  Patient has failed dicyclomine, Imodium.  No improvement on gluten-free diet for 3 months.  Denies any change in medications.  Gallbladder removed remotely.  Colonoscopy completed  in 2019 as outlined above.  Stool studies - December 2021.  Differential diagnosis quite broad.  Less likely infectious etiology.  Doubt malignancy or inflammatory bowel disease based on symptoms.  Cannot exclude microscopic colitis, bile acid diarrhea, pancreatic insufficiency, celiac.  As outlined, completed colonoscopy in 2019, per report, random colon biopsies obtained however according to pathology report cecal biopsies submitted.  It is not clear that biopsies from throughout the colon obtained to rule out microscopic colitis.  Epigastric pain/bloating: Present for a few months.  She has chronic GERD, typical symptoms well controlled.  Denies aspirin or NSAIDs.  Query gastritis/peptic ulcer disease/hiatal hernia/complicated GERD.  Plan:  1. Continue omeprazole 20 mg daily. 2. Start Colestid 1 g, take 2 capsules once or twice daily for diarrhea.  Do not take within 2 hours of other medications.  Hold for constipation.  Call if persisting diarrhea. 3. Stool for fecal elastase, labs for TTG IgA, IgA, CRP, sed rate. 4. We will plan on upper endoscopy to evaluate upper GI symptoms, consider small bowel biopsy. May benefit from additional colonoscopy to complete random colon biopsies and exclude microscopic colitis, will discuss further with Dr. Abbey Chatters.  Patient has a spinal cord stimulator. ASA II.  I have discussed the risks, alternatives, benefits with regards to but not limited to the risk of reaction to medication, bleeding, infection, perforation and the patient is agreeable to proceed. Written consent to be obtained.

## 2021-01-25 NOTE — Patient Instructions (Signed)
1. Please try Colestid two capsule once or twice daily for diarrhea. DO NOT TAKE WITHIN TWO HOURS OF OTHER MEDICATIONS. If you get constipation, hold the medication until stools become loose again. If you have persistent diarrhea or constipation, let me know so I can adjust your dose.  2. Please have labs and stool test done. We will contact you with results as available. If you do not hear from me within few days after submitting stool test, please call our office so we can make sure we received your results.  3. We will be in touch regarding upper endoscopy and if you will need repeat colonoscopy. We will call first of the week.

## 2021-01-27 LAB — TISSUE TRANSGLUTAMINASE, IGA: Transglutaminase IgA: <2 U/mL (ref 0–3)

## 2021-01-27 LAB — IGA: IgA/Immunoglobulin A, Serum: 51 mg/dL — ABNORMAL LOW (ref 64–422)

## 2021-01-27 LAB — C-REACTIVE PROTEIN: CRP: <1 mg/L (ref 0–10)

## 2021-01-27 LAB — SEDIMENTATION RATE: Sed Rate: 2 mm/hr (ref 0–40)

## 2021-01-28 ENCOUNTER — Telehealth: Payer: Self-pay

## 2021-01-28 ENCOUNTER — Telehealth: Payer: Self-pay | Admitting: Internal Medicine

## 2021-01-28 NOTE — Telephone Encounter (Signed)
As noted, I sent RX to [facility] and pharmacy confirmed receipt on Friday at 11:47AM, see RX colestid.   Please call pharmacy and find out what the issue is. Thanks.

## 2021-01-28 NOTE — Telephone Encounter (Signed)
Noted, already sent to [facility]

## 2021-01-28 NOTE — Telephone Encounter (Signed)
Pt had LMOM over the weekend and called again this afternoon. She said she saw Neil Crouch, PA on Friday and is having trouble getting her prescription filled. She is anxious about getting her medication and wants to get it today. Please call/advise.   [phone]

## 2021-01-28 NOTE — Telephone Encounter (Signed)
Hi Magda Paganini  This pt phoned stating that the Rx you were suppose to call in to [facility] never made it. (the pharmacist checked it in according to the chart). She stated she wants to talk to you no other Dr. Please advise

## 2021-01-29 MED ORDER — CHOLESTYRAMINE 4 G PO PACK
PACK | ORAL | 1 refills | Status: DC
Start: 2021-01-29 — End: 2021-08-07

## 2021-01-29 NOTE — Telephone Encounter (Signed)
Sent in [place] since colestid on back order. Please let pt know.

## 2021-01-29 NOTE — Addendum Note (Signed)
Addended by: Mahala Menghini on: 01/29/2021 12:11 PM   Modules accepted: Orders

## 2021-01-29 NOTE — Telephone Encounter (Signed)
Phoned to the [facility] in [place] and was advised by the pharmacist that this medication is on back order and they have called several other places and they do not have it in either. Pt was advised by the pharmacist to wait or advise [place] so something else can be sent in for her.

## 2021-01-30 NOTE — Telephone Encounter (Signed)
Phoned and advised the pt of her Rx being phoned in.

## 2021-02-04 ENCOUNTER — Other Ambulatory Visit: Payer: Self-pay

## 2021-02-04 LAB — PANCREATIC ELASTASE, FECAL: Pancreatic Elastase, Fecal: 180 ug Elast./g — ABNORMAL LOW (ref >200)

## 2021-02-04 MED ORDER — PEG 3350-KCL-NA BICARB-NACL 420 G PO SOLR: 4000.0000 mL | ORAL | 0 refills | Status: DC

## 2021-02-21 ENCOUNTER — Other Ambulatory Visit: Payer: Self-pay | Admitting: Gastroenterology

## 2021-02-21 MED ORDER — PANCRELIPASE (LIP-PROT-AMYL) 36000-114000 UNITS PO CPEP: ORAL_CAPSULE | ORAL | 5 refills | Status: DC

## 2021-03-01 ENCOUNTER — Other Ambulatory Visit: Payer: Self-pay

## 2021-03-01 ENCOUNTER — Other Ambulatory Visit (HOSPITAL_COMMUNITY)
Admission: RE | Admit: 2021-03-01 | Discharge: 2021-03-01 | Disposition: A | Discharge status: Home / Self Care | Payer: Medicare Other | Source: Ambulatory Visit | Attending: Internal Medicine | Admitting: Internal Medicine

## 2021-03-01 DIAGNOSIS — Z01812 Encounter for preprocedural laboratory examination: Secondary | ICD-10-CM | POA: Insufficient documentation

## 2021-03-01 DIAGNOSIS — Z9049 Acquired absence of other specified parts of digestive tract: Secondary | ICD-10-CM | POA: Diagnosis not present

## 2021-03-01 DIAGNOSIS — D125 Benign neoplasm of sigmoid colon: Secondary | ICD-10-CM | POA: Diagnosis not present

## 2021-03-01 DIAGNOSIS — Z85820 Personal history of malignant melanoma of skin: Secondary | ICD-10-CM | POA: Diagnosis not present

## 2021-03-01 DIAGNOSIS — D124 Benign neoplasm of descending colon: Secondary | ICD-10-CM | POA: Diagnosis not present

## 2021-03-01 DIAGNOSIS — Z20822 Contact with and (suspected) exposure to covid-19: Secondary | ICD-10-CM | POA: Insufficient documentation

## 2021-03-01 DIAGNOSIS — Z79890 Hormone replacement therapy (postmenopausal): Secondary | ICD-10-CM | POA: Diagnosis not present

## 2021-03-01 DIAGNOSIS — I10 Essential (primary) hypertension: Secondary | ICD-10-CM | POA: Diagnosis not present

## 2021-03-01 DIAGNOSIS — Z79899 Other long term (current) drug therapy: Secondary | ICD-10-CM | POA: Diagnosis not present

## 2021-03-01 DIAGNOSIS — Z7984 Long term (current) use of oral hypoglycemic drugs: Secondary | ICD-10-CM | POA: Diagnosis not present

## 2021-03-01 DIAGNOSIS — Z791 Long term (current) use of non-steroidal anti-inflammatories (NSAID): Secondary | ICD-10-CM | POA: Diagnosis not present

## 2021-03-01 DIAGNOSIS — D120 Benign neoplasm of cecum: Secondary | ICD-10-CM | POA: Diagnosis not present

## 2021-03-01 DIAGNOSIS — K2970 Gastritis, unspecified, without bleeding: Secondary | ICD-10-CM | POA: Diagnosis not present

## 2021-03-01 DIAGNOSIS — Z881 Allergy status to other antibiotic agents status: Secondary | ICD-10-CM | POA: Diagnosis not present

## 2021-03-01 DIAGNOSIS — R1013 Epigastric pain: Secondary | ICD-10-CM | POA: Diagnosis present

## 2021-03-01 DIAGNOSIS — K319 Disease of stomach and duodenum, unspecified: Secondary | ICD-10-CM | POA: Diagnosis not present

## 2021-03-01 DIAGNOSIS — R140 Abdominal distension (gaseous): Secondary | ICD-10-CM | POA: Diagnosis not present

## 2021-03-01 DIAGNOSIS — E119 Type 2 diabetes mellitus without complications: Secondary | ICD-10-CM | POA: Diagnosis not present

## 2021-03-01 DIAGNOSIS — Z87442 Personal history of urinary calculi: Secondary | ICD-10-CM | POA: Diagnosis not present

## 2021-03-01 DIAGNOSIS — Z981 Arthrodesis status: Secondary | ICD-10-CM | POA: Diagnosis not present

## 2021-03-01 DIAGNOSIS — E785 Hyperlipidemia, unspecified: Secondary | ICD-10-CM | POA: Diagnosis not present

## 2021-03-01 DIAGNOSIS — E890 Postprocedural hypothyroidism: Secondary | ICD-10-CM | POA: Diagnosis not present

## 2021-03-01 DIAGNOSIS — Z90710 Acquired absence of both cervix and uterus: Secondary | ICD-10-CM | POA: Diagnosis not present

## 2021-03-01 DIAGNOSIS — Z8379 Family history of other diseases of the digestive system: Secondary | ICD-10-CM | POA: Diagnosis not present

## 2021-03-01 LAB — BASIC METABOLIC PANEL
Anion gap: 8 (ref 5–15)
BUN: 15 mg/dL (ref 8–23)
CO2: 29 mmol/L (ref 22–32)
Calcium: 8.9 mg/dL (ref 8.9–10.3)
Chloride: 101 mmol/L (ref 98–111)
Creatinine, Ser: 0.91 mg/dL (ref 0.44–1.00)
GFR, Estimated: >60 mL/min (ref >60)
Glucose, Bld: 108 mg/dL — ABNORMAL HIGH (ref 70–99)
Potassium: 3.7 mmol/L (ref 3.5–5.1)
Sodium: 138 mmol/L (ref 135–145)

## 2021-03-02 LAB — SARS CORONAVIRUS 2 (TAT 6-24 HRS): SARS Coronavirus 2: NEGATIVE

## 2021-03-04 ENCOUNTER — Ambulatory Visit (HOSPITAL_COMMUNITY)
Admission: RE | Admit: 2021-03-04 | Discharge: 2021-03-04 | Disposition: A | Discharge status: Home / Self Care | Payer: Medicare Other | Source: Ambulatory Visit | Attending: Internal Medicine | Admitting: Internal Medicine

## 2021-03-04 ENCOUNTER — Other Ambulatory Visit: Payer: Self-pay

## 2021-03-04 ENCOUNTER — Ambulatory Visit (HOSPITAL_COMMUNITY): Payer: Medicare Other | Admitting: Anesthesiology

## 2021-03-04 ENCOUNTER — Encounter (HOSPITAL_COMMUNITY): Payer: Self-pay

## 2021-03-04 ENCOUNTER — Encounter (HOSPITAL_COMMUNITY)
Admission: RE | Disposition: A | Discharge status: Home / Self Care | Payer: Self-pay | Source: Ambulatory Visit | Attending: Internal Medicine

## 2021-03-04 DIAGNOSIS — Z85820 Personal history of malignant melanoma of skin: Secondary | ICD-10-CM | POA: Insufficient documentation

## 2021-03-04 DIAGNOSIS — E119 Type 2 diabetes mellitus without complications: Secondary | ICD-10-CM | POA: Insufficient documentation

## 2021-03-04 DIAGNOSIS — Z87442 Personal history of urinary calculi: Secondary | ICD-10-CM | POA: Insufficient documentation

## 2021-03-04 DIAGNOSIS — R1013 Epigastric pain: Secondary | ICD-10-CM | POA: Insufficient documentation

## 2021-03-04 DIAGNOSIS — K2970 Gastritis, unspecified, without bleeding: Secondary | ICD-10-CM | POA: Diagnosis not present

## 2021-03-04 DIAGNOSIS — E890 Postprocedural hypothyroidism: Secondary | ICD-10-CM | POA: Insufficient documentation

## 2021-03-04 DIAGNOSIS — Z981 Arthrodesis status: Secondary | ICD-10-CM | POA: Insufficient documentation

## 2021-03-04 DIAGNOSIS — Z79890 Hormone replacement therapy (postmenopausal): Secondary | ICD-10-CM | POA: Insufficient documentation

## 2021-03-04 DIAGNOSIS — Z9049 Acquired absence of other specified parts of digestive tract: Secondary | ICD-10-CM | POA: Insufficient documentation

## 2021-03-04 DIAGNOSIS — Z7984 Long term (current) use of oral hypoglycemic drugs: Secondary | ICD-10-CM | POA: Insufficient documentation

## 2021-03-04 DIAGNOSIS — K529 Noninfective gastroenteritis and colitis, unspecified: Secondary | ICD-10-CM

## 2021-03-04 DIAGNOSIS — K319 Disease of stomach and duodenum, unspecified: Secondary | ICD-10-CM | POA: Insufficient documentation

## 2021-03-04 DIAGNOSIS — I10 Essential (primary) hypertension: Secondary | ICD-10-CM | POA: Insufficient documentation

## 2021-03-04 DIAGNOSIS — D120 Benign neoplasm of cecum: Secondary | ICD-10-CM | POA: Insufficient documentation

## 2021-03-04 DIAGNOSIS — Z79899 Other long term (current) drug therapy: Secondary | ICD-10-CM | POA: Insufficient documentation

## 2021-03-04 DIAGNOSIS — D124 Benign neoplasm of descending colon: Secondary | ICD-10-CM | POA: Insufficient documentation

## 2021-03-04 DIAGNOSIS — Z8379 Family history of other diseases of the digestive system: Secondary | ICD-10-CM | POA: Insufficient documentation

## 2021-03-04 DIAGNOSIS — E785 Hyperlipidemia, unspecified: Secondary | ICD-10-CM | POA: Insufficient documentation

## 2021-03-04 DIAGNOSIS — Z791 Long term (current) use of non-steroidal anti-inflammatories (NSAID): Secondary | ICD-10-CM | POA: Insufficient documentation

## 2021-03-04 DIAGNOSIS — R140 Abdominal distension (gaseous): Secondary | ICD-10-CM | POA: Insufficient documentation

## 2021-03-04 DIAGNOSIS — D125 Benign neoplasm of sigmoid colon: Secondary | ICD-10-CM | POA: Diagnosis not present

## 2021-03-04 DIAGNOSIS — Z90710 Acquired absence of both cervix and uterus: Secondary | ICD-10-CM | POA: Insufficient documentation

## 2021-03-04 DIAGNOSIS — Z881 Allergy status to other antibiotic agents status: Secondary | ICD-10-CM | POA: Insufficient documentation

## 2021-03-04 HISTORY — PX: ESOPHAGOGASTRODUODENOSCOPY (EGD) WITH PROPOFOL: SHX5813

## 2021-03-04 HISTORY — PX: BIOPSY: SHX5522

## 2021-03-04 HISTORY — PX: POLYPECTOMY: SHX5525

## 2021-03-04 HISTORY — PX: COLONOSCOPY WITH PROPOFOL: SHX5780

## 2021-03-04 LAB — GLUCOSE, CAPILLARY: Glucose-Capillary: 118 mg/dL — ABNORMAL HIGH (ref 70–99)

## 2021-03-04 SURGERY — COLONOSCOPY WITH PROPOFOL
Anesthesia: General

## 2021-03-04 MED ORDER — PROPOFOL 10 MG/ML IV BOLUS
INTRAVENOUS | Status: DC | PRN
  Administered 2021-03-04: 50 mg via INTRAVENOUS

## 2021-03-04 MED ORDER — LACTATED RINGERS IV SOLN: INTRAVENOUS | Status: DC

## 2021-03-04 MED ORDER — LIDOCAINE HCL (PF) 2 % IJ SOLN
INTRAMUSCULAR | Status: AC
  Filled 2021-03-04: qty 5

## 2021-03-04 MED ORDER — SUCRALFATE 1 G PO TABS: 1.0000 g | ORAL_TABLET | Freq: Four times a day (QID) | ORAL | 1 refills | Status: DC

## 2021-03-04 MED ORDER — PROPOFOL 500 MG/50ML IV EMUL
INTRAVENOUS | Status: DC | PRN
  Administered 2021-03-04: 150 ug/kg/min via INTRAVENOUS

## 2021-03-04 MED ORDER — EPHEDRINE SULFATE 50 MG/ML IJ SOLN
INTRAMUSCULAR | Status: DC | PRN
  Administered 2021-03-04: 10 mg via INTRAVENOUS

## 2021-03-04 MED ORDER — EPHEDRINE 5 MG/ML INJ
INTRAVENOUS | Status: AC
  Filled 2021-03-04: qty 10

## 2021-03-04 MED ORDER — OMEPRAZOLE MAGNESIUM 20 MG PO TBEC: 20.0000 mg | DELAYED_RELEASE_TABLET | Freq: Two times a day (BID) | ORAL | 5 refills | Status: DC

## 2021-03-04 NOTE — Anesthesia Preprocedure Evaluation (Addendum)
Anesthesia Evaluation  Patient identified by MRN, date of birth, ID band Patient awake    Reviewed: Allergy & Precautions, NPO status , Patient's Chart, lab work & pertinent test results, reviewed documented beta blocker date and time   History of Anesthesia Complications (+) PONV and history of anesthetic complications  Airway Mallampati: II  TM Distance: >3 FB Neck ROM: Full   Comment: ACDF Dental  (+) Dental Advisory Given, Teeth Intact   Pulmonary neg pulmonary ROS,    Pulmonary exam normal breath sounds clear to auscultation       Cardiovascular Exercise Tolerance: Good hypertension, Pt. on medications and Pt. on home beta blockers Normal cardiovascular exam+ Valvular Problems/Murmurs  Rhythm:Regular Rate:Normal     Neuro/Psych Anxiety negative neurological ROS     GI/Hepatic GERD  Medicated and Controlled,  Endo/Other  diabetes, Well Controlled, Type 2, Oral Hypoglycemic AgentsHypothyroidism   Renal/GU      Musculoskeletal  (+) Arthritis  (ACDF, lumbar fusion),   Abdominal   Peds  Hematology   Anesthesia Other Findings Spinal cord stimulator  Reproductive/Obstetrics                            Anesthesia Physical Anesthesia Plan  ASA: II  Anesthesia Plan: General   Post-op Pain Management:    Induction:   PONV Risk Score and Plan: Propofol infusion  Airway Management Planned: Nasal Cannula and Natural Airway  Additional Equipment:   Intra-op Plan:   Post-operative Plan:   Informed Consent: I have reviewed the patients History and Physical, chart, labs and discussed the procedure including the risks, benefits and alternatives for the proposed anesthesia with the patient or authorized representative who has indicated his/her understanding and acceptance.     Dental advisory given  Plan Discussed with: CRNA and Surgeon  Anesthesia Plan Comments:         Anesthesia Quick Evaluation

## 2021-03-04 NOTE — Anesthesia Postprocedure Evaluation (Signed)
Anesthesia Post Note  Patient: Heidi Thompson  Procedure(s) Performed: COLONOSCOPY WITH PROPOFOL (N/A ) ESOPHAGOGASTRODUODENOSCOPY (EGD) WITH PROPOFOL (N/A ) BIOPSY POLYPECTOMY  Patient location during evaluation: Phase II Anesthesia Type: General Level of consciousness: awake, sedated, awake and alert and patient cooperative Pain management: satisfactory to patient Vital Signs Assessment: post-procedure vital signs reviewed and stable Respiratory status: spontaneous breathing, nonlabored ventilation and respiratory function stable Cardiovascular status: bradycardic Postop Assessment: no apparent nausea or vomiting Anesthetic complications: no   No complications documented.   Last Vitals:  Vitals:   03/04/21 0813  BP: (!) 153/63  Pulse: (!) 59  Resp: 17  Temp: 36.5 C  SpO2: 100%    Last Pain:  Vitals:   03/04/21 0855  TempSrc:   PainSc: 0-No pain                 Nicholus Chandran

## 2021-03-04 NOTE — Transfer of Care (Signed)
Immediate Anesthesia Transfer of Care Note  Patient: Heidi Thompson  Procedure(s) Performed: COLONOSCOPY WITH PROPOFOL (N/A ) ESOPHAGOGASTRODUODENOSCOPY (EGD) WITH PROPOFOL (N/A ) BIOPSY POLYPECTOMY  Patient Location: PACU  Anesthesia Type:General  Level of Consciousness: awake, alert , oriented and patient cooperative  Airway & Oxygen Therapy: Patient Spontanous Breathing  Post-op Assessment: Report given to RN, Post -op Vital signs reviewed and stable and Patient moving all extremities X 4  Post vital signs: Reviewed and stable  Last Vitals:  Vitals Value Taken Time  BP    Temp    Pulse    Resp    SpO2      Last Pain:  Vitals:   03/04/21 0855  TempSrc:   PainSc: 0-No pain      Patients Stated Pain Goal: 3 (99/35/70 1779)  Complications: No complications documented.

## 2021-03-04 NOTE — H&P (Signed)
Primary Care Physician:  [name], FNP Primary Gastroenterologist:  Dr. Abbey Chatters  Pre-Procedure History & Physical: HPI:  Heidi Thompson is a 75 y.o. female is here for an EGD for abdominal pain/bloating and colonoscopy to be performed for chronic diarrhea/incontinence.  Past Medical History:  Diagnosis Date  . Acid reflux   . Anxiety   . Arthritis    spondylotic radiculopathy, neck, back, shoulder   . Cancer ([place]) 1978   melanoma- on R leg, removed  . Diabetes mellitus without complication ([place])   . H/O total thyroidectomy 1982  . Heart murmur   . History of kidney stones    on imaging, saw 2 stones in kidney & pt. reports that she thinks she has passed one on her own at one time.   . Hyperlipidemia   . Hypertension   . PONV (postoperative nausea and vomiting)     Past Surgical History:  Procedure Laterality Date  . ABDOMINAL HYSTERECTOMY    . ANTERIOR CERVICAL DECOMP/DISCECTOMY FUSION N/A 03/18/2017   Procedure: ACDF C4-7;  Surgeon: Melina Schools, MD;  Location: [facility];  Service: Orthopedics;  Laterality: N/A;  Requests 4 hours  . ANTERIOR LAT LUMBAR FUSION N/A 07/14/2018   Procedure: Anterior lateral lumbar fusion L2-4;  Surgeon: Melina Schools, MD;  Location: [facility];  Service: Orthopedics;  Laterality: N/A;  7 hrs  . APPENDECTOMY    . BACK SURGERY    . CHOLECYSTECTOMY    . EYE SURGERY Bilateral    cataracts /w IOL  . JOINT REPLACEMENT Bilateral    knees  . SHOULDER ARTHROSCOPY Right 2016  . SPINAL CORD STIMULATOR INSERTION N/A 05/02/2020   Procedure: SPINAL CORD STIMULATOR INSERTION;  Surgeon: Melina Schools, MD;  Location: [facility];  Service: Orthopedics;  Laterality: N/A;  2.5 hrs  . thumb joi    . thumb Joint  Right    thumb joint On R hand replaced  . TONSILLECTOMY    . TOTAL THYROIDECTOMY      Prior to Admission medications   Medication Sig Start Date End Date Taking? Authorizing Provider  atorvastatin (LIPITOR) 40 MG tablet Take 40 mg by mouth at bedtime.   Yes  [provider]  carvedilol (COREG) 25 MG tablet Take 12.5 mg by mouth 2 (two) times daily.    Yes [provider]  chlorthalidone (HYGROTON) 25 MG tablet Take 25 mg by mouth daily.   Yes [provider]  Cholecalciferol (VITAMIN D3) 2000 units TABS Take 2,000 Units by mouth daily.   Yes [provider]  cholestyramine (QUESTRAN) 4 g packet Take 1/2 to 1 pack mixed in water once daily for diarrhea. (DO NOT TAKE WITHIN TWO HOURS OF OTHER MEDICATIONS). Hold if you skip a day without a bowel movement, resume when stools loose or frequent. Patient taking differently: Take 2-4 g by mouth See admin instructions. Take 1/2 to 1 pack mixed in water once daily for diarrhea. (DO NOT TAKE WITHIN TWO HOURS OF OTHER MEDICATIONS). Hold if you skip a day without a bowel movement, resume when stools loose or frequent. 01/29/21  Yes Mahala Menghini, PA-C  escitalopram (LEXAPRO) 20 MG tablet Take 20 mg by mouth daily before breakfast.  02/14/17  Yes [provider]  ferrous sulfate 325 (65 FE) MG tablet Take 325 mg by mouth daily with breakfast.   Yes [provider]  levothyroxine (SYNTHROID, LEVOTHROID) 50 MCG tablet Take 50 mcg by mouth daily before breakfast. 2 hours after breakfast   Yes [provider]  lipase/protease/amylase (CREON) 36000 UNITS CPEP capsule Take 2 capsules with meals and 1 capsule with snacks. Take up to 8 per day. Take with first bite of food. 02/21/21  Yes Mahala Menghini, PA-C  LORazepam (ATIVAN) 1 MG tablet Take 1 mg by mouth 2 (two) times daily.   Yes [provider]  meloxicam (MOBIC) 15 MG tablet Take 150 mg by mouth daily. 12/11/20  Yes [provider]  metFORMIN (GLUCOPHAGE) 500 MG tablet Take 500 mg by mouth every evening.   Yes [provider]  Multiple Vitamins-Minerals (MULTIVITAMIN WITH MINERALS) tablet Take 1 tablet by mouth daily.   Yes [provider]  omeprazole (PRILOSEC OTC) 20 MG  tablet Take 20 mg by mouth daily before breakfast.   Yes [provider]  Polyethyl Glycol-Propyl Glycol (SYSTANE OP) Place 1 drop into both eyes daily as needed (Dry eye).   Yes [provider]  polyethylene glycol-electrolytes (TRILYTE) 420 g solution Take 4,000 mLs by mouth as directed. 02/04/21  Yes Eloise Harman, DO  potassium chloride SA (K-DUR,KLOR-CON) 20 MEQ tablet Take 20 mEq by mouth daily with lunch.    Yes [provider]  valsartan (DIOVAN) 80 MG tablet Take 80 mg by mouth daily. 12/31/19  Yes [provider]    Allergies as of 02/04/2021 - Review Complete 01/25/2021  Allergen Reaction Noted  . Azithromycin  04/23/2020    Family History  Problem Relation Age of Onset  . Crohn's disease Sister   . Celiac disease Daughter   . Colon cancer Neg Hx     Social History   Socioeconomic History  . Marital status: Married    Spouse name: Not on file  . Number of children: Not on file  . Years of education: Not on file  . Highest education level: Not on file  Occupational History  . Not on file  Tobacco Use  . Smoking status: Never Smoker  . Smokeless tobacco: Never Used  Vaping Use  . Vaping Use: Never used  Substance and Sexual Activity  . Alcohol use: No  . Drug use: No  . Sexual activity: Not on file  Other Topics Concern  . Not on file  Social History Narrative  . Not on file   Social Determinants of Health   Financial Resource Strain: Not on file  Food Insecurity: Not on file  Transportation Needs: Not on file  Physical Activity: Not on file  Stress: Not on file  Social Connections: Not on file  Intimate Partner Violence: Not on file    Review of Systems: See HPI, otherwise negative ROS  Physical Exam: Vital signs in last 24 hours: Temp:  [97.7 F (36.5 C)] 97.7 F (36.5 C) (05/02 0813) Pulse Rate:  [59] 59 (05/02 0813) Resp:  [17] 17 (05/02 0813) BP: (153)/(63) 153/63 (05/02 0813) SpO2:  [100 %] 100 %  (05/02 0813)   General:   Alert,  Well-developed, well-nourished, pleasant and cooperative in NAD Head:  Normocephalic and atraumatic. Eyes:  Sclera clear, no icterus.   Conjunctiva pink. Ears:  Normal auditory acuity. Nose:  No deformity, discharge,  or lesions. Mouth:  No deformity or lesions, dentition normal. Neck:  Supple; no masses or thyromegaly. Lungs:  Clear throughout to auscultation.   No wheezes, crackles, or rhonchi. No acute distress. Heart:  Regular rate and rhythm; no murmurs, clicks, rubs,  or gallops. Abdomen:  Soft, nontender and nondistended. No masses, hepatosplenomegaly or hernias noted. Normal bowel sounds,  without guarding, and without rebound.   Msk:  Symmetrical without gross deformities. Normal posture. Extremities:  Without clubbing or edema. Neurologic:  Alert and  oriented x4;  grossly normal neurologically. Skin:  Intact without significant lesions or rashes. Cervical Nodes:  No significant cervical adenopathy. Psych:  Alert and cooperative. Normal mood and affect.  Impression/Plan: Nicie Milan is here for an EGD for abdominal pain/bloating and colonoscopy to be performed for chronic diarrhea/incontinence.  The risks of the procedure including infection, bleed, or perforation as well as benefits, limitations, alternatives and imponderables have been reviewed with the patient. Questions have been answered. All parties agreeable.

## 2021-03-04 NOTE — Op Note (Signed)
[facility] Patient Name: Heidi Thompson Procedure Date: 03/04/2021 9:01 AM MRN: [id number] Date of Birth: [birth year] Attending MD: Elon Alas. Abbey Chatters DO CSN: [id number] Age: 75 Admit Type: Outpatient Procedure:                Colonoscopy Indications:              Chronic diarrhea Providers:                Elon Alas. Abbey Chatters, DO, Gwynneth Albright RN, RN,                            Suzan Garibaldi. Risa Grill, Technician Referring MD:              Medicines:                See the Anesthesia note for documentation of the                            administered medications Complications:            No immediate complications. Estimated Blood Loss:     Estimated blood loss was minimal. Procedure:                Pre-Anesthesia Assessment:                           - The anesthesia plan was to use monitored                            anesthesia care (MAC).                           After obtaining informed consent, the colonoscope                            was passed under direct vision. Throughout the                            procedure, the patient's blood pressure, pulse, and                            oxygen saturations were monitored continuously. The                            [id number]-H190DL ([id number]) scope was introduced through                            the anus and advanced to the the cecum, identified                            by appendiceal orifice and ileocecal valve. The                            colonoscopy was performed without difficulty. The                            patient tolerated the procedure well.  The quality                            of the bowel preparation was evaluated using the                            BBPS [facility] Bowel Preparation Scale) with scores                            of: Right Colon = 3, Transverse Colon = 3 and Left                            Colon = 3 (entire mucosa seen well with no residual                            staining, small  fragments of stool or opaque                            liquid). The total BBPS score equals 9. Scope In: 9:03:02 AM Scope Out: 9:17:55 AM Scope Withdrawal Time: 0 hours 11 minutes 42 seconds  Total Procedure Duration: 0 hours 14 minutes 53 seconds  Findings:      The perianal and digital rectal examinations were normal.      Non-bleeding internal hemorrhoids were found during endoscopy.      Multiple small-mouthed diverticula were found in the sigmoid colon.      A 7 mm polyp was found in the cecum. The polyp was flat. The polyp was       removed with a cold snare. Resection and retrieval were complete.      A 2 mm polyp was found in the descending colon. The polyp was sessile.       The polyp was removed with a cold biopsy forceps. Resection and       retrieval were complete.      A 5 mm polyp was found in the sigmoid colon. The polyp was sessile. The       polyp was removed with a cold snare. Resection and retrieval were       complete.      Biopsies for histology were taken with a cold forceps from the ascending       colon, transverse colon and descending colon for evaluation of       microscopic colitis. Impression:               - Non-bleeding internal hemorrhoids.                           - Diverticulosis in the sigmoid colon.                           - One 7 mm polyp in the cecum, removed with a cold                            snare. Resected and retrieved.                           - One 2 mm polyp  in the descending colon, removed                            with a cold biopsy forceps. Resected and retrieved.                           - One 5 mm polyp in the sigmoid colon, removed with                            a cold snare. Resected and retrieved.                           - Biopsies were taken with a cold forceps from the                            ascending colon, transverse colon and descending                            colon for evaluation of microscopic  colitis. Moderate Sedation:      Per Anesthesia Care Recommendation:           - Patient has a contact number available for                            emergencies. The signs and symptoms of potential                            delayed complications were discussed with the                            patient. Return to normal activities tomorrow.                            Written discharge instructions were provided to the                            patient.                           - Resume previous diet.                           - Continue present medications.                           - Await pathology results.                           - Repeat colonoscopy in 5 years for surveillance.                           - Return to GI clinic in 3 months. Procedure Code(s):        --- Professional ---  45385, Colonoscopy, flexible; with removal of                            tumor(s), polyp(s), or other lesion(s) by snare                            technique                           45380, 41, Colonoscopy, flexible; with biopsy,                            single or multiple Diagnosis Code(s):        --- Professional ---                           K64.8, Other hemorrhoids                           K63.5, Polyp of colon                           K52.9, Noninfective gastroenteritis and colitis,                            unspecified                           K57.30, Diverticulosis of large intestine without                            perforation or abscess without bleeding CPT copyright 2019 [employer]. All rights reserved. The codes documented in this report are preliminary and upon coder review may  be revised to meet current compliance requirements. Elon Alas. Abbey Chatters, DO [place] Abbey Chatters, DO 03/04/2021 9:21:36 AM This report has been signed electronically. Number of Addenda: 0

## 2021-03-04 NOTE — Discharge Instructions (Addendum)
EGD Discharge instructions Please read the instructions outlined below and refer to this sheet in the next few weeks. These discharge instructions provide you with general information on caring for yourself after you leave the hospital. Your doctor may also give you specific instructions. While your treatment has been planned according to the most current medical practices available, unavoidable complications occasionally occur. If you have any problems or questions after discharge, please call your doctor. ACTIVITY  You may resume your regular activity but move at a slower pace for the next 24 hours.   Take frequent rest periods for the next 24 hours.   Walking will help expel (get rid of) the air and reduce the bloated feeling in your abdomen.   No driving for 24 hours (because of the anesthesia (medicine) used during the test).   You may shower.   Do not sign any important legal documents or operate any machinery for 24 hours (because of the anesthesia used during the test).  NUTRITION  Drink plenty of fluids.   You may resume your normal diet.   Begin with a light meal and progress to your normal diet.   Avoid alcoholic beverages for 24 hours or as instructed by your caregiver.  MEDICATIONS  You may resume your normal medications unless your caregiver tells you otherwise.  WHAT YOU CAN EXPECT TODAY  You may experience abdominal discomfort such as a feeling of fullness or "gas" pains.  FOLLOW-UP  Your doctor will discuss the results of your test with you.  SEEK IMMEDIATE MEDICAL ATTENTION IF ANY OF THE FOLLOWING OCCUR:  Excessive nausea (feeling sick to your stomach) and/or vomiting.   Severe abdominal pain and distention (swelling).   Trouble swallowing.   Temperature over 101 F (37.8 C).   Rectal bleeding or vomiting of blood.     Colonoscopy Discharge Instructions  Read the instructions outlined below and refer to this sheet in the next few weeks. These  discharge instructions provide you with general information on caring for yourself after you leave the hospital. Your doctor may also give you specific instructions. While your treatment has been planned according to the most current medical practices available, unavoidable complications occasionally occur.   ACTIVITY  You may resume your regular activity, but move at a slower pace for the next 24 hours.   Take frequent rest periods for the next 24 hours.   Walking will help get rid of the air and reduce the bloated feeling in your belly (abdomen).   No driving for 24 hours (because of the medicine (anesthesia) used during the test).    Do not sign any important legal documents or operate any machinery for 24 hours (because of the anesthesia used during the test).  NUTRITION  Drink plenty of fluids.   You may resume your normal diet as instructed by your doctor.   Begin with a light meal and progress to your normal diet. Heavy or fried foods are harder to digest and may make you feel sick to your stomach (nauseated).   Avoid alcoholic beverages for 24 hours or as instructed.  MEDICATIONS  You may resume your normal medications unless your doctor tells you otherwise.  WHAT YOU CAN EXPECT TODAY  Some feelings of bloating in the abdomen.   Passage of more gas than usual.   Spotting of blood in your stool or on the toilet paper.  IF YOU HAD POLYPS REMOVED DURING THE COLONOSCOPY:  No aspirin products for 7 days or as instructed.  No alcohol for 7 days or as instructed.   Eat a soft diet for the next 24 hours.  FINDING OUT THE RESULTS OF YOUR TEST Not all test results are available during your visit. If your test results are not back during the visit, make an appointment with your caregiver to find out the results. Do not assume everything is normal if you have not heard from your caregiver or the medical facility. It is important for you to follow up on all of your test results.   SEEK IMMEDIATE MEDICAL ATTENTION IF:  You have more than a spotting of blood in your stool.   Your belly is swollen (abdominal distention).   You are nauseated or vomiting.   You have a temperature over 101.   You have abdominal pain or discomfort that is severe or gets worse throughout the day.   Your EGD revealed a mild amount inflammation in your stomach.  I took biopsies of this to rule out infection with a bacteria called H. pylori.  Also took extensive biopsies of your small bowel to rule out celiac disease.  Increase your omeprazole to twice daily for the next 8 weeks.  I will send in Carafate to take up to 4 times needed for breakthrough symptoms.  Your colonoscopy revealed 3 polyp(s) which I removed successfully. Await pathology results, my office will contact you. I recommend repeating colonoscopy in 5 years for surveillance purposes.  I took extensive biopsies of your colon as well to rule out microscopic colitis which can cause chronic diarrhea and bloating.  I did not see any active inflammation indicative of underlying inflammatory bowel disease such as Crohn's disease or ulcerative colitis.   Follow-up with GI in 2 to 3 months.     I hope you have a great rest of your week!  Elon Alas. Abbey Chatters, D.O. Gastroenterology and Hepatology [facility]   Colon Polyps  Colon polyps are tissue growths inside the colon, which is part of the large intestine. They are one of the types of polyps that can grow in the body. A polyp may be a round bump or a mushroom-shaped growth. You could have one polyp or more than one. Most colon polyps are noncancerous (benign). However, some colon polyps can become cancerous over time. Finding and removing the polyps early can help prevent this. What are the causes? The exact cause of colon polyps is not known. What increases the risk? The following factors may make you more likely to develop this condition:  Having  a family history of colorectal cancer or colon polyps.  Being older than 77 years of age.  Being younger than 77 years of age and having a significant family history of colorectal cancer or colon polyps or a genetic condition that puts you at higher risk of getting colon polyps.  Having inflammatory bowel disease, such as ulcerative colitis or Crohn's disease.  Having certain conditions passed from parent to child (hereditary conditions), such as: ? Familial adenomatous polyposis (FAP). ? Lynch syndrome. ? Turcot syndrome. ? Peutz-Jeghers syndrome. ? MUTYH-associated polyposis (MAP).  Being overweight.  Certain lifestyle factors. These include smoking cigarettes, drinking too much alcohol, not getting enough exercise, and eating a diet that is high in fat and red meat and low in fiber.  Having had childhood cancer that was treated with radiation of the abdomen. What are the signs or symptoms? Many times, there are no symptoms. If you have symptoms, they may include:  Blood coming from  the rectum during a bowel movement.  Blood in the stool (feces). The blood may be bright red or very dark in color.  Pain in the abdomen.  A change in bowel habits, such as constipation or diarrhea. How is this diagnosed? This condition is diagnosed with a colonoscopy. This is a procedure in which a lighted, flexible scope is inserted into the opening between the buttocks (anus) and then passed into the colon to examine the area. Polyps are sometimes found when a colonoscopy is done as part of routine cancer screening tests. How is this treated? This condition is treated by removing any polyps that are found. Most polyps can be removed during a colonoscopy. Those polyps will then be tested for cancer. Additional treatment may be needed depending on the results of testing. Follow these instructions at home: Eating and drinking  Eat foods that are high in fiber, such as fruits, vegetables, and whole  grains.  Eat foods that are high in calcium and vitamin D, such as milk, cheese, yogurt, eggs, liver, fish, and broccoli.  Limit foods that are high in fat, such as fried foods and desserts.  Limit the amount of red meat, precooked or cured meat, or other processed meat that you eat, such as hot dogs, sausages, bacon, or meat loaves.  Limit sugary drinks.   Lifestyle  Maintain a healthy weight, or lose weight if recommended by your health care provider.  Exercise every day or as told by your health care provider.  Do not use any products that contain nicotine or tobacco, such as cigarettes, e-cigarettes, and chewing tobacco. If you need help quitting, ask your health care provider.  Do not drink alcohol if: ? Your health care provider tells you not to drink. ? You are pregnant, may be pregnant, or are planning to become pregnant.  If you drink alcohol: ? Limit how much you use to:  0-1 drink a day for women.  0-2 drinks a day for men. ? Know how much alcohol is in your drink. In the [place] one drink equals one 12 oz bottle of beer (355 mL), one 5 oz glass of wine (148 mL), or one 1 oz glass of hard liquor (44 mL). General instructions  Take over-the-counter and prescription medicines only as told by your health care provider.  Keep all follow-up visits. This is important. This includes having regularly scheduled colonoscopies. Talk to your health care provider about when you need a colonoscopy. Contact a health care provider if:  You have new or worsening bleeding during a bowel movement.  You have new or increased blood in your stool.  You have a change in bowel habits.  You lose weight for no known reason. Summary  Colon polyps are tissue growths inside the colon, which is part of the large intestine. They are one type of polyp that can grow in the body.  Most colon polyps are noncancerous (benign), but some can become cancerous over time.  This condition is  diagnosed with a colonoscopy.  This condition is treated by removing any polyps that are found. Most polyps can be removed during a colonoscopy. This information is not intended to replace advice given to you by your health care provider. Make sure you discuss any questions you have with your health care provider. Document Revised: 02/08/2020 Document Reviewed: 02/08/2020 Elsevier Patient Education  2021 [place].  Gastritis, Adult  Gastritis is swelling (inflammation) of the stomach. Gastritis can develop quickly (acute). It can also develop  slowly over time (chronic). It is important to get help for this condition. If you do not get help, your stomach can bleed, and you can get sores (ulcers) in your stomach. What are the causes? This condition may be caused by:  Germs that get to your stomach.  Drinking too much alcohol.  Medicines you are taking.  Too much acid in the stomach.  A disease of the intestines or stomach.  Stress.  An allergic reaction.  Crohn's disease.  Some cancer treatments (radiation). Sometimes the cause of this condition is not known. What are the signs or symptoms? Symptoms of this condition include:  Pain in your stomach.  A burning feeling in your stomach.  Feeling sick to your stomach (nauseous).  Throwing up (vomiting).  Feeling too full after you eat.  Weight loss.  Bad breath.  Throwing up blood.  Blood in your poop (stool). How is this diagnosed? This condition may be diagnosed with:  Your medical history and symptoms.  A physical exam.  Tests. These can include: ? Blood tests. ? Stool tests. ? A procedure to look inside your stomach (upper endoscopy). ? A test in which a sample of tissue is taken for testing (biopsy). How is this treated? Treatment for this condition depends on what caused it. You may be given:  Antibiotic medicine, if your condition was caused by germs.  H2 blockers and similar medicines, if  your condition was caused by too much acid. Follow these instructions at home: Medicines  Take over-the-counter and prescription medicines only as told by your doctor.  If you were prescribed an antibiotic medicine, take it as told by your doctor. Do not stop taking it even if you start to feel better. Eating and drinking  Eat small meals often, instead of large meals.  Avoid foods and drinks that make your symptoms worse.  Drink enough fluid to keep your pee (urine) pale yellow.   Alcohol use  Do not drink alcohol if: ? Your doctor tells you not to drink. ? You are pregnant, may be pregnant, or are planning to become pregnant.  If you drink alcohol: ? Limit your use to:  0-1 drink a day for women.  0-2 drinks a day for men. ? Be aware of how much alcohol is in your drink. In the [place] one drink equals one 12 oz bottle of beer (355 mL), one 5 oz glass of wine (148 mL), or one 1 oz glass of hard liquor (44 mL). General instructions  Talk with your doctor about ways to manage stress. You can exercise or do deep breathing, meditation, or yoga.  Do not smoke or use products that have nicotine or tobacco. If you need help quitting, ask your doctor.  Keep all follow-up visits as told by your doctor. This is important. Contact a doctor if:  Your symptoms get worse.  Your symptoms go away and then come back. Get help right away if:  You throw up blood or something that looks like coffee grounds.  You have black or dark red poop.  You throw up any time you try to drink fluids.  Your stomach pain gets worse.  You have a fever.  You do not feel better after one week. Summary  Gastritis is swelling (inflammation) of the stomach.  You must get help for this condition. If you do not get help, your stomach can bleed, and you can get sores (ulcers).  This condition is diagnosed with medical history, physical exam,  or tests.  You can be treated with medicines for germs or  medicines to block too much acid in your stomach. This information is not intended to replace advice given to you by your health care provider. Make sure you discuss any questions you have with your health care provider. Document Revised: 03/09/2018 Document Reviewed: 03/09/2018 Elsevier Patient Education  [place].

## 2021-03-04 NOTE — Op Note (Signed)
[facility] Patient Name: Heidi Thompson Procedure Date: 03/04/2021 8:38 AM MRN: [id number] Date of Birth: [birth year] Attending MD: Elon Alas. Edgar Frisk CSN: [id number] Age: 75 Admit Type: Outpatient Procedure:                Upper GI endoscopy Indications:              Epigastric abdominal pain, Abdominal bloating Providers:                Elon Alas. Abbey Chatters, DO, Gwynneth Albright RN, RN,                            Suzan Garibaldi. Risa Grill, Technician Referring MD:              Medicines:                See the Anesthesia note for documentation of the                            administered medications Complications:            No immediate complications. Estimated Blood Loss:     Estimated blood loss was minimal. Procedure:                Pre-Anesthesia Assessment:                           - The anesthesia plan was to use monitored                            anesthesia care (MAC).                           After obtaining informed consent, the endoscope was                            passed under direct vision. Throughout the                            procedure, the patient's blood pressure, pulse, and                            oxygen saturations were monitored continuously. The                            GIF-H190 ([id number]) scope was introduced through the                            mouth, and advanced to the second part of duodenum.                            The upper GI endoscopy was accomplished without                            difficulty. The patient tolerated the procedure  well. Scope In: 8:53:01 AM Scope Out: 8:58:31 AM Total Procedure Duration: 0 hours 5 minutes 30 seconds  Findings:      There is no endoscopic evidence of Barrett's esophagus, bleeding, areas       of erosion, esophagitis, hiatal hernia, ulcerations or varices in the       entire esophagus.      Patchy mild inflammation characterized by erythema was found in the        entire examined stomach. Biopsies were taken with a cold forceps for       Helicobacter pylori testing.      The duodenal bulb, first portion of the duodenum and second portion of       the duodenum were normal. Biopsies for histology were taken with a cold       forceps for evaluation of celiac disease. Impression:               - Gastritis. Biopsied.                           - Normal duodenal bulb, first portion of the                            duodenum and second portion of the duodenum.                            Biopsied. Moderate Sedation:      Per Anesthesia Care Recommendation:           - Patient has a contact number available for                            emergencies. The signs and symptoms of potential                            delayed complications were discussed with the                            patient. Return to normal activities tomorrow.                            Written discharge instructions were provided to the                            patient.                           - Resume previous diet.                           - Continue present medications.                           - Await pathology results.                           - Return to GI clinic in 3 months.                           -  Use a proton pump inhibitor PO daily. Procedure Code(s):        --- Professional ---                           [phone], Esophagogastroduodenoscopy, flexible,                            transoral; with biopsy, single or multiple Diagnosis Code(s):        --- Professional ---                           K29.70, Gastritis, unspecified, without bleeding                           R10.13, Epigastric pain                           R14.0, Abdominal distension (gaseous) CPT copyright 2019 American Medical Association. All rights reserved. The codes documented in this report are preliminary and upon coder review may  be revised to meet current compliance requirements. Elon Alas.  Abbey Chatters, DO [place] Abbey Chatters, DO 03/04/2021 9:02:14 AM This report has been signed electronically. Number of Addenda: 0

## 2021-03-06 LAB — SURGICAL PATHOLOGY

## 2021-03-08 ENCOUNTER — Encounter (HOSPITAL_COMMUNITY): Payer: Self-pay | Admitting: Internal Medicine

## 2021-03-13 ENCOUNTER — Telehealth: Payer: Self-pay

## 2021-03-13 NOTE — Telephone Encounter (Signed)
Pt called- she has not gotten her results from her pathology yet and she was anxious about her results and continuing to have problems.  I checked path report and advised pt that it did not show any malignancies in the biopsies that Dr.Carver took but I do not know what his recommendations would be and he would have to let us know that once he returns tomorrow. Pt was ok with this and this helps her anxiety to know due to her family history.    Pt stated she is not any better from her office visit with Magda Paganini, she said she is taking the [place]  the sucralfate 4xday, 2 creon before meals and taking her omeprazole bid. And she is not feeling any different than when she came in for her ov. She is having reduced appetite, abd pain and burning all the time and it occurs with or without food, she is having watery diarrhea 5-6x a day, she is very tired.   No fever, blood in stool, dizziness, or weight loss.   She is aware that I will let Magda Paganini and Dr.Carver know what is going on and we will get back with her as soon as we know something.

## 2021-03-13 NOTE — Telephone Encounter (Signed)
Phoned and spoke with the pt, advised her once again we are waiting for input from Dr. Abbey Chatters. Some abnormality on small bowel biopsy, not called celiac. Pt has been doing gluten in her diet.

## 2021-03-13 NOTE — Telephone Encounter (Signed)
She will need follow up ov in next 2 weeks. She is very complicated and would benefit from seeing Dr. Abbey Chatters. Please arrange.  Await input from Dr. Abbey Chatters from path. Some abnormality on small bowel biopsy but they did not call celiac. She had been told to add gluten back into her diet prior to procedure.

## 2021-03-13 NOTE — Telephone Encounter (Signed)
noted 

## 2021-03-15 NOTE — Telephone Encounter (Signed)
At the moment patient is scheduled with [facility] on 8/2 and is on a cancellation list. Also placed on July recall to see Dr Abbey Chatters.

## 2021-03-21 ENCOUNTER — Telehealth: Payer: Self-pay | Admitting: *Deleted

## 2021-03-21 NOTE — Telephone Encounter (Signed)
LVM today   Patient left voice mail stating she has called 3 times about her results but then did say someone had given her results and she is still having issues.    Would like a call back please at [phone]

## 2021-03-25 NOTE — Telephone Encounter (Signed)
Phoned and LMOVM for the pt to return call 

## 2021-03-26 NOTE — Telephone Encounter (Signed)
Pt called, I returned her call and the pt (very fussy about [place] not being able to contact her sooner) is still having diarrhea and abd pain. States her medications are not working. She was not given her results of bloodwork. Pt has a appt with you tomorrow at 2:00 pm and I advised her that you will be able to speak with her more regarding the symptoms but more so her complaints. Please advise.

## 2021-03-27 ENCOUNTER — Encounter: Payer: Self-pay | Admitting: Internal Medicine

## 2021-03-27 ENCOUNTER — Other Ambulatory Visit: Payer: Self-pay

## 2021-03-27 ENCOUNTER — Ambulatory Visit: Payer: Medicare Other | Admitting: Internal Medicine

## 2021-03-27 VITALS — BP 148/71 | HR 67 | Temp 96.9°F | Ht 63.0 in | Wt 166.4 lb

## 2021-03-27 DIAGNOSIS — K529 Noninfective gastroenteritis and colitis, unspecified: Secondary | ICD-10-CM | POA: Diagnosis not present

## 2021-03-27 DIAGNOSIS — R1013 Epigastric pain: Secondary | ICD-10-CM | POA: Diagnosis not present

## 2021-03-27 DIAGNOSIS — K219 Gastro-esophageal reflux disease without esophagitis: Secondary | ICD-10-CM | POA: Diagnosis not present

## 2021-03-27 NOTE — Patient Instructions (Addendum)
I am sorry to hear you are still having ongoing diarrhea.  At this point I think it would be reasonable to trial off medications that have classically cause chronic diarrhea.  Medications on your list that have been implicated previously are the Mobic, omeprazole, metformin.  I would trial off these medications for 2 to 3 weeks at a time to see if your symptoms improve.  Synthroid has also been implicated in chronic diarrhea though I do not think you will be able to stop this.  Continue on Creon and Questran for now.  We may try you on a 3-week course of Xifaxan for potential small intestinal bacterial overgrowth syndrome if symptoms do not improve by stopping your medications.  Follow-up in 6 to 8 weeks.  At [facility] we value your feedback. You may receive a survey about your visit today. Please share your experience as we strive to create trusting relationships with our patients to provide genuine, compassionate, quality care.  We appreciate your understanding and patience as we review any laboratory studies, imaging, and other diagnostic tests that are ordered as we care for you. Our office policy is 5 business days for review of these results, and any emergent or urgent results are addressed in a timely manner for your best interest. If you do not hear from our office in 1 week, please contact us.   We also encourage the use of MyChart, which contains your medical information for your review as well. If you are not enrolled in this feature, an access code is on this after visit summary for your convenience. Thank you for allowing [place] to be involved in your care.  It was great to see you today!  I hope you have a great rest of your spring!!    Elon Alas. Abbey Chatters, D.O. Gastroenterology and Hepatology [facility]

## 2021-03-28 NOTE — Progress Notes (Signed)
Referring Provider: [name], FNP Primary Care Physician:  [name], FNP Primary GI:  Dr. Abbey Chatters  Chief Complaint  Patient presents with  . Abdominal Pain    Mid upper abd  . Diarrhea    Only little better. Afraid to leave home. Occurs day and night  . Bloated    HPI:   Heidi Thompson is a 75 y.o. female who presents to clinic today for follow-up visit.  She has a history of chronic diarrhea for years.  Notes associated symptoms of urgency and at times incontinence.  She will will have up to 5-6 loose bowel movements daily.  Sometimes has nocturnal BMs as well requiring her to get out of bed.  Patient's daughter and granddaughter have celiac disease.  Patient's sister has Crohn's disease.  Stool studies 10/08/2020: Stool culture negative for Salmonella/Shigella/Campylobacter/E. coli.  C. difficile toxin gene NAA negative, O&P exam negative.  Also has chronic reflux and abdominal pain.  Recently underwent EGD which showed H. pylori negative gastritis.  Duodenal biopsies did show mild increase in intraepithelial lymphocytes though no significant villous blunting.  Patient does chronically take Mobic.  Colonoscopy with biopsies negative for microscopic colitis.  3 tubular adenomas removed with a 5-year recall.  Patient is currently taking Creon as well as Questran.  She does notice slight improvement in her diarrhea since starting these medications though she is unsure which one is helping.  She went on a strict gluten-free diet for 3 months without improvement in her symptoms.  She has reintroduced gluten back in her diet and has not noticed a difference.    Past Medical History:  Diagnosis Date  . Acid reflux   . Anxiety   . Arthritis    spondylotic radiculopathy, neck, back, shoulder   . Cancer ([place]) 1978   melanoma- on R leg, removed  . Diabetes mellitus without complication ([place])   . H/O total thyroidectomy 1982  . Heart murmur   . History of kidney stones     on imaging, saw 2 stones in kidney & pt. reports that she thinks she has passed one on her own at one time.   . Hyperlipidemia   . Hypertension   . PONV (postoperative nausea and vomiting)     Past Surgical History:  Procedure Laterality Date  . ABDOMINAL HYSTERECTOMY    . ANTERIOR CERVICAL DECOMP/DISCECTOMY FUSION N/A 03/18/2017   Procedure: ACDF C4-7;  Surgeon: Melina Schools, MD;  Location: [place];  Service: Orthopedics;  Laterality: N/A;  Requests 4 hours  . ANTERIOR LAT LUMBAR FUSION N/A 07/14/2018   Procedure: Anterior lateral lumbar fusion L2-4;  Surgeon: Melina Schools, MD;  Location: [place];  Service: Orthopedics;  Laterality: N/A;  7 hrs  . APPENDECTOMY    . BACK SURGERY    . BIOPSY  03/04/2021   Procedure: BIOPSY;  Surgeon: Eloise Harman, DO;  [facility];  Service: Endoscopy;;  Duodenal and gastric antrum and body, random colon biopsies   . CHOLECYSTECTOMY    . COLONOSCOPY WITH PROPOFOL N/A 03/04/2021   Procedure: COLONOSCOPY WITH PROPOFOL;  Surgeon: Eloise Harman, DO;  [facility];  Service: Endoscopy;  Laterality: N/A;  AM (diabetic)  . ESOPHAGOGASTRODUODENOSCOPY (EGD) WITH PROPOFOL N/A 03/04/2021   Procedure: ESOPHAGOGASTRODUODENOSCOPY (EGD) WITH PROPOFOL;  Surgeon: Eloise Harman, DO;  [facility];  Service: Endoscopy;  Laterality: N/A;  . EYE SURGERY Bilateral    cataracts /w IOL  . JOINT REPLACEMENT Bilateral  knees  . POLYPECTOMY  03/04/2021   Procedure: POLYPECTOMY;  Surgeon: Eloise Harman, DO;  [facility];  Service: Endoscopy;;  cecal polyp, descending colon polyp  . SHOULDER ARTHROSCOPY Right 2016  . SPINAL CORD STIMULATOR INSERTION N/A 05/02/2020   Procedure: SPINAL CORD STIMULATOR INSERTION;  Surgeon: Melina Schools, MD;  Location: [place];  Service: Orthopedics;  Laterality: N/A;  2.5 hrs  . thumb joi    . thumb Joint  Right    thumb joint On R hand replaced  . TONSILLECTOMY    . TOTAL THYROIDECTOMY       Current Outpatient Medications  Medication Sig Dispense Refill  . atorvastatin (LIPITOR) 40 MG tablet Take 40 mg by mouth at bedtime.    . carvedilol (COREG) 25 MG tablet Take 12.5 mg by mouth 2 (two) times daily.     . chlorthalidone (HYGROTON) 25 MG tablet Take 25 mg by mouth daily.    . Cholecalciferol (VITAMIN D3) 2000 units TABS Take 2,000 Units by mouth daily.    . cholestyramine (QUESTRAN) 4 g packet Take 1/2 to 1 pack mixed in water once daily for diarrhea. (DO NOT TAKE WITHIN TWO HOURS OF OTHER MEDICATIONS). Hold if you skip a day without a bowel movement, resume when stools loose or frequent. (Patient taking differently: Take 2-4 g by mouth See admin instructions. Take 1/2 to 1 pack mixed in water once daily for diarrhea. (DO NOT TAKE WITHIN TWO HOURS OF OTHER MEDICATIONS). Hold if you skip a day without a bowel movement, resume when stools loose or frequent.) 90 each 1  . escitalopram (LEXAPRO) 20 MG tablet Take 20 mg by mouth daily before breakfast.     . ferrous sulfate 325 (65 FE) MG tablet Take 325 mg by mouth daily with breakfast.    . levothyroxine (SYNTHROID, LEVOTHROID) 50 MCG tablet Take 50 mcg by mouth daily before breakfast. 2 hours after breakfast    . lipase/protease/amylase (CREON) 36000 UNITS CPEP capsule Take 2 capsules with meals and 1 capsule with snacks. Take up to 8 per day. Take with first bite of food. 240 capsule 5  . LORazepam (ATIVAN) 1 MG tablet Take 1 mg by mouth 2 (two) times daily.    . meloxicam (MOBIC) 15 MG tablet Take 15 mg by mouth daily.    . metFORMIN (GLUCOPHAGE) 500 MG tablet Take 500 mg by mouth every evening.    . Multiple Vitamins-Minerals (MULTIVITAMIN WITH MINERALS) tablet Take 1 tablet by mouth daily.    Marland Kitchen omeprazole (PRILOSEC OTC) 20 MG tablet Take 1 tablet (20 mg total) by mouth 2 (two) times daily before a meal. 60 tablet 5  . Polyethyl Glycol-Propyl Glycol (SYSTANE OP) Place 1 drop into both eyes daily as needed (Dry eye).    .  potassium chloride SA (K-DUR,KLOR-CON) 20 MEQ tablet Take 20 mEq by mouth daily with lunch.     . sucralfate (CARAFATE) 1 g tablet Take 1 tablet (1 g total) by mouth 4 (four) times daily. 120 tablet 1  . valsartan (DIOVAN) 80 MG tablet Take 80 mg by mouth daily.    . polyethylene glycol-electrolytes (TRILYTE) 420 g solution Take 4,000 mLs by mouth as directed. (Patient not taking: Reported on 03/27/2021) 4000 mL 0   No current facility-administered medications for this visit.    Allergies as of 03/27/2021 - Review Complete 03/27/2021  Allergen Reaction Noted  . Azithromycin  04/23/2020    Family History  Problem Relation Age of Onset  .  Crohn's disease Sister   . Celiac disease Daughter   . Colon cancer Neg Hx     Social History   Socioeconomic History  . Marital status: Married    Spouse name: Not on file  . Number of children: Not on file  . Years of education: Not on file  . Highest education level: Not on file  Occupational History  . Not on file  Tobacco Use  . Smoking status: Never Smoker  . Smokeless tobacco: Never Used  Vaping Use  . Vaping Use: Never used  Substance and Sexual Activity  . Alcohol use: No  . Drug use: No  . Sexual activity: Not on file  Other Topics Concern  . Not on file  Social History Narrative  . Not on file   Social Determinants of Health   Financial Resource Strain: Not on file  Food Insecurity: Not on file  Transportation Needs: Not on file  Physical Activity: Not on file  Stress: Not on file  Social Connections: Not on file    Subjective: Review of Systems  Constitutional: Negative for chills and fever.  HENT: Negative for congestion and hearing loss.   Eyes: Negative for blurred vision and double vision.  Respiratory: Negative for cough and shortness of breath.   Cardiovascular: Negative for chest pain and palpitations.  Gastrointestinal: Positive for abdominal pain and diarrhea. Negative for blood in stool, constipation,  heartburn, melena and vomiting.  Genitourinary: Negative for dysuria and urgency.  Musculoskeletal: Negative for joint pain and myalgias.  Skin: Negative for itching and rash.  Neurological: Negative for dizziness and headaches.  Psychiatric/Behavioral: Negative for depression. The patient is not nervous/anxious.      Objective: BP (!) 148/71   Pulse 67   Temp (!) 96.9 F (36.1 C) (Temporal)   Ht 5\' 3"  (1.6 m)   Wt 166 lb 6.4 oz (75.5 kg)   BMI 29.48 kg/m  Physical Exam Constitutional:      Appearance: Normal appearance.  HENT:     Head: Normocephalic and atraumatic.  Eyes:     Extraocular Movements: Extraocular movements intact.     Conjunctiva/sclera: Conjunctivae normal.  Cardiovascular:     Rate and Rhythm: Normal rate and regular rhythm.  Pulmonary:     Effort: Pulmonary effort is normal.     Breath sounds: Normal breath sounds.  Abdominal:     General: Bowel sounds are normal.     Palpations: Abdomen is soft.  Musculoskeletal:        General: No swelling. Normal range of motion.     Cervical back: Normal range of motion and neck supple.  Skin:    General: Skin is warm and dry.     Coloration: Skin is not jaundiced.  Neurological:     General: No focal deficit present.     Mental Status: She is alert and oriented to person, place, and time.  Psychiatric:        Mood and Affect: Mood normal.        Behavior: Behavior normal.      Assessment: *Diarrhea-chronic, not improved *Abdominal pain *GERD-relatively well controlled on omeprazole twice daily *Pancreatic insufficiency-fecal elastase 180  Plan: Etiology of patient's chronic diarrhea remains elusive.  She does note some mild improvement on both Creon and Questran though these medications were started relatively same time so it is difficult to tell which 1 is actually helping.  She does continue to have 5-6 loose bowel movements daily as well as nightly BMs  and occasional incontinence.  Taking a closer  look at her medication list she does take Mobic, metformin, omeprazole, all of which have been linked to chronic diarrhea.  Also takes Synthroid but will be difficult for her to stop this.  She does state her TSH is monitored regularly.  Recommended she stop her Mobic metformin and omeprazole 1 at a time for 2 to 3 weeks and monitor for improvement in her symptoms to rule out medication side effect of her chronic diarrhea.  We can consider a trial of Xifaxan for potential SIBO if above measures do not improve her symptoms.  To continue on Creon as well as Questran for now.  Follow-up in 6 to 8 weeks or sooner if needed.  03/28/2021 2:51 PM   Disclaimer: This note was dictated with voice recognition software. Similar sounding words can inadvertently be transcribed and may not be corrected upon review.

## 2021-05-08 ENCOUNTER — Encounter: Payer: Self-pay | Admitting: Internal Medicine

## 2021-05-08 ENCOUNTER — Ambulatory Visit: Payer: Medicare Other | Admitting: Internal Medicine

## 2021-05-08 ENCOUNTER — Other Ambulatory Visit: Payer: Self-pay

## 2021-05-08 ENCOUNTER — Telehealth: Payer: Self-pay

## 2021-05-08 VITALS — BP 127/59 | HR 62 | Temp 97.3°F | Ht 63.0 in | Wt 164.8 lb

## 2021-05-08 DIAGNOSIS — K219 Gastro-esophageal reflux disease without esophagitis: Secondary | ICD-10-CM

## 2021-05-08 DIAGNOSIS — R1013 Epigastric pain: Secondary | ICD-10-CM

## 2021-05-08 DIAGNOSIS — K580 Irritable bowel syndrome with diarrhea: Secondary | ICD-10-CM | POA: Diagnosis not present

## 2021-05-08 MED ORDER — RIFAXIMIN 550 MG PO TABS: 550.0000 mg | ORAL_TABLET | Freq: Three times a day (TID) | ORAL | 0 refills | Status: AC

## 2021-05-08 NOTE — Progress Notes (Signed)
Referring Provider: [name], FNP Primary Care Physician:  [name], FNP Primary GI:  Dr. Abbey Chatters  Chief Complaint  Patient presents with   Gastroesophageal Reflux    Med is helping   Diarrhea    Daily, 3-4x/day    HPI:   Heidi Thompson is a 75 y.o. female who presents to clinic today for follow-up visit.  She has a history of chronic diarrhea for years.  Notes associated symptoms of urgency and at times incontinence.  Sometimes has nocturnal BMs as well requiring her to get out of bed.  Patient's daughter and granddaughter have celiac disease.  Patient's sister has Crohn's disease.   Stool studies 10/08/2020: Stool culture negative for Salmonella/Shigella/Campylobacter/E. coli.  C. difficile toxin gene NAA negative, O&P exam negative.   Also has chronic reflux and abdominal pain. 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  Duodenal biopsies did show mild increase in intraepithelial lymphocytes though no significant villous blunting.  Patient does chronically take Mobic.   Colonoscopy with biopsies negative for microscopic colitis.  3 tubular adenomas removed with a 5-year recall.   Patient is currently taking Questran.  She does notice slight improvement in her diarrhea since starting.  Previously on Creon which did not help her diarrhea.  Has trialed Imodium as well as dicyclomine without improvement.   She went on a strict gluten-free diet for 3 months without improvement in her symptoms.  She has reintroduced gluten back in her diet and has not noticed a difference.   On previous visit I recommended that she trial herself off of a few of her chronic medications which have been linked to diarrhea.  She states she went nearly a week without her Mobic and diarrhea did not improve though her joint pains were unbearable.  She went off her omeprazole for 3 days and states her heartburn was so severe she had to restart it.  Has not trialed stopping her  metformin.  Does chronically take Synthroid as well.  Does state her symptoms are mildly improved having 3-4 loose bowel movements daily.  Past Medical History:  Diagnosis Date   Acid reflux    Anxiety    Arthritis    spondylotic radiculopathy, neck, back, shoulder    Cancer ([place]) 1978   melanoma- on R leg, removed   Diabetes mellitus without complication ([place])    H/O total thyroidectomy 1982   Heart murmur    History of kidney stones    on imaging, saw 2 stones in kidney & pt. reports that she thinks she has passed one on her own at one time.    Hyperlipidemia    Hypertension    PONV (postoperative nausea and vomiting)     Past Surgical History:  Procedure Laterality Date   ABDOMINAL HYSTERECTOMY     ANTERIOR CERVICAL DECOMP/DISCECTOMY FUSION N/A 03/18/2017   Procedure: ACDF C4-7;  Surgeon: Melina Schools, MD;  Location: [place];  Service: Orthopedics;  Laterality: N/A;  Requests 4 hours   ANTERIOR LAT LUMBAR FUSION N/A 07/14/2018   Procedure: Anterior lateral lumbar fusion L2-4;  Surgeon: Melina Schools, MD;  Location: [place];  Service: Orthopedics;  Laterality: N/A;  7 hrs   APPENDECTOMY     BACK SURGERY     BIOPSY  03/04/2021   Procedure: BIOPSY;  Surgeon: Eloise Harman, DO;  [facility];  Service: Endoscopy;;  Duodenal and gastric antrum and body, random colon biopsies    CHOLECYSTECTOMY  COLONOSCOPY WITH PROPOFOL N/A 03/04/2021   Procedure: COLONOSCOPY WITH PROPOFOL;  Surgeon: Eloise Harman, DO;  [facility];  Service: Endoscopy;  Laterality: N/A;  AM (diabetic)   ESOPHAGOGASTRODUODENOSCOPY (EGD) WITH PROPOFOL N/A 03/04/2021   Procedure: ESOPHAGOGASTRODUODENOSCOPY (EGD) WITH PROPOFOL;  Surgeon: Eloise Harman, DO;  [facility];  Service: Endoscopy;  Laterality: N/A;   EYE SURGERY Bilateral    cataracts /w IOL   JOINT REPLACEMENT Bilateral    knees   POLYPECTOMY  03/04/2021   Procedure: POLYPECTOMY;  Surgeon: Eloise Harman, DO;   [facility];  Service: Endoscopy;;  cecal polyp, descending colon polyp   SHOULDER ARTHROSCOPY Right 2016   SPINAL CORD STIMULATOR INSERTION N/A 05/02/2020   Procedure: SPINAL CORD STIMULATOR INSERTION;  Surgeon: Melina Schools, MD;  Location: [place];  Service: Orthopedics;  Laterality: N/A;  2.5 hrs   thumb joi     thumb Joint  Right    thumb joint On R hand replaced   TONSILLECTOMY     TOTAL THYROIDECTOMY      Current Outpatient Medications  Medication Sig Dispense Refill   atorvastatin (LIPITOR) 40 MG tablet Take 40 mg by mouth at bedtime.     carvedilol (COREG) 25 MG tablet Take 12.5 mg by mouth 2 (two) times daily.      chlorthalidone (HYGROTON) 25 MG tablet Take 25 mg by mouth daily.     Cholecalciferol (VITAMIN D3) 2000 units TABS Take 2,000 Units by mouth daily.     cholestyramine (QUESTRAN) 4 g packet Take 1/2 to 1 pack mixed in water once daily for diarrhea. (DO NOT TAKE WITHIN TWO HOURS OF OTHER MEDICATIONS). Hold if you skip a day without a bowel movement, resume when stools loose or frequent. (Patient taking differently: Take 2-4 g by mouth See admin instructions. Take 1/2 to 1 pack mixed in water once daily for diarrhea. (DO NOT TAKE WITHIN TWO HOURS OF OTHER MEDICATIONS). Hold if you skip a day without a bowel movement, resume when stools loose or frequent.) 90 each 1   escitalopram (LEXAPRO) 20 MG tablet Take 20 mg by mouth daily before breakfast.      ferrous sulfate 325 (65 FE) MG tablet Take 325 mg by mouth daily with breakfast.     levothyroxine (SYNTHROID, LEVOTHROID) 50 MCG tablet Take 50 mcg by mouth daily before breakfast. 2 hours after breakfast     lipase/protease/amylase (CREON) 36000 UNITS CPEP capsule Take 2 capsules with meals and 1 capsule with snacks. Take up to 8 per day. Take with first bite of food. 240 capsule 5   LORazepam (ATIVAN) 1 MG tablet Take 1 mg by mouth 2 (two) times daily.     meloxicam (MOBIC) 15 MG tablet Take 15 mg by mouth daily.      metFORMIN (GLUCOPHAGE) 500 MG tablet Take 500 mg by mouth every evening.     omeprazole (PRILOSEC OTC) 20 MG tablet Take 1 tablet (20 mg total) by mouth 2 (two) times daily before a meal. 60 tablet 5   Polyethyl Glycol-Propyl Glycol (SYSTANE OP) Place 1 drop into both eyes daily as needed (Dry eye).     potassium chloride SA (K-DUR,KLOR-CON) 20 MEQ tablet Take 20 mEq by mouth daily with lunch.      rifaximin (XIFAXAN) 550 MG TABS tablet Take 1 tablet (550 mg total) by mouth 3 (three) times daily for 14 days. 42 tablet 0   valsartan (DIOVAN) 80 MG tablet Take 80 mg  by mouth daily.     Multiple Vitamins-Minerals (MULTIVITAMIN WITH MINERALS) tablet Take 1 tablet by mouth daily. (Patient not taking: Reported on 05/08/2021)     polyethylene glycol-electrolytes (TRILYTE) 420 g solution Take 4,000 mLs by mouth as directed. (Patient not taking: Reported on 05/08/2021) 4000 mL 0   sucralfate (CARAFATE) 1 g tablet Take 1 tablet (1 g total) by mouth 4 (four) times daily. (Patient not taking: Reported on 05/08/2021) 120 tablet 1   No current facility-administered medications for this visit.    Allergies as of 05/08/2021 - Review Complete 05/08/2021  Allergen Reaction Noted   Azithromycin  04/23/2020    Family History  Problem Relation Age of Onset   Crohn's disease Sister    Celiac disease Daughter    Colon cancer Neg Hx     Social History   Socioeconomic History   Marital status: Married    Spouse name: Not on file   Number of children: Not on file   Years of education: Not on file   Highest education level: Not on file  Occupational History   Not on file  Tobacco Use   Smoking status: Never   Smokeless tobacco: Never  Vaping Use   Vaping Use: Never used  Substance and Sexual Activity   Alcohol use: No   Drug use: No   Sexual activity: Not on file  Other Topics Concern   Not on file  Social History Narrative   Not on file   Social Determinants of Health   Financial Resource  Strain: Not on file  Food Insecurity: Not on file  Transportation Needs: Not on file  Physical Activity: Not on file  Stress: Not on file  Social Connections: Not on file    Subjective: Review of Systems  Constitutional:  Negative for chills and fever.  HENT:  Negative for congestion and hearing loss.   Eyes:  Negative for blurred vision and double vision.  Respiratory:  Negative for cough and shortness of breath.   Cardiovascular:  Negative for chest pain and palpitations.  Gastrointestinal:  Positive for diarrhea. Negative for abdominal pain, blood in stool, constipation, heartburn, melena and vomiting.  Genitourinary:  Negative for dysuria and urgency.  Musculoskeletal:  Negative for joint pain and myalgias.  Skin:  Negative for itching and rash.  Neurological:  Negative for dizziness and headaches.  Psychiatric/Behavioral:  Negative for depression. The patient is not nervous/anxious.     Objective: BP (!) 127/59   Pulse 62   Temp (!) 97.3 F (36.3 C) (Temporal)   Ht 5\' 3"  (1.6 m)   Wt 164 lb 12.8 oz (74.8 kg)   BMI 29.19 kg/m  Physical Exam Constitutional:      Appearance: Normal appearance.  HENT:     Head: Normocephalic and atraumatic.  Eyes:     Extraocular Movements: Extraocular movements intact.     Conjunctiva/sclera: Conjunctivae normal.  Cardiovascular:     Rate and Rhythm: Normal rate and regular rhythm.     Heart sounds: Murmur heard.  Pulmonary:     Effort: Pulmonary effort is normal.     Breath sounds: Normal breath sounds.  Abdominal:     General: Bowel sounds are normal.     Palpations: Abdomen is soft.  Musculoskeletal:        General: No swelling. Normal range of motion.     Cervical back: Normal range of motion and neck supple.  Skin:    General: Skin is warm and dry.  Coloration: Skin is not jaundiced.  Neurological:     General: No focal deficit present.     Mental Status: She is alert and oriented to person, place, and time.   Psychiatric:        Mood and Affect: Mood normal.        Behavior: Behavior normal.     Assessment: *Diarrhea-chronic *Abdominal pain *GERD-relatively well controlled on omeprazole twice daily   Plan: Etiology of patient's chronic diarrhea remains unclear.  She does note some mild improvement on Questran.  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  She has trialed and failed Imodium, dicyclomine, Creon.   Taking a closer look at her medication list she does take Mobic, metformin, omeprazole, all of which have been linked to chronic diarrhea.  Also takes Synthroid but will be difficult for her to stop this.  She does state her TSH is monitored regularly.  She trialed off of Mobic and omeprazole and her diarrhea did not improve.   I will send in a 2-week course of Xifaxan today and monitor for improvement.  I have also printed out a low FODMAP diet.   Follow-up in 8 weeks or sooner if needed.   05/08/2021 2:47 PM   Disclaimer: This note was dictated with voice recognition software. Similar sounding words can inadvertently be transcribed and may not be corrected upon review.

## 2021-05-08 NOTE — Telephone Encounter (Signed)
Pt has been approved for Xifaxan 550mg --[place] case number [id number]---[id number] 02/06/2021 through 05/08/2022.--Dx code used: K 58.0 ( IBS-D). Will give to Manuela Schwartz to have scanned into the pt's chart.

## 2021-05-08 NOTE — Patient Instructions (Signed)
For your diarrhea I am going to start you on a 2-week course of Xifaxan 550 mg 3 times daily.  I am also going to print you off a specialized diet called a low FODMAP diet.  Continue on current medication.  Follow-up in 8 weeks.  Congratulations on your new great grandbaby!  At [facility] we value your feedback. You may receive a survey about your visit today. Please share your experience as we strive to create trusting relationships with our patients to provide genuine, compassionate, quality care.  We appreciate your understanding and patience as we review any laboratory studies, imaging, and other diagnostic tests that are ordered as we care for you. Our office policy is 5 business days for review of these results, and any emergent or urgent results are addressed in a timely manner for your best interest. If you do not hear from our office in 1 week, please contact us.   We also encourage the use of MyChart, which contains your medical information for your review as well. If you are not enrolled in this feature, an access code is on this after visit summary for your convenience. Thank you for allowing [place] to be involved in your care.  It was great to see you today!  I hope you have a great rest of your summer!!    Elon Alas. Abbey Chatters, D.O. Gastroenterology and Hepatology [facility]

## 2021-05-09 ENCOUNTER — Telehealth: Payer: Self-pay | Admitting: Internal Medicine

## 2021-05-09 NOTE — Telephone Encounter (Signed)
PATIENT CALLED AND SAID THAT THE XIAFAXIN WAS COVERED UNDER HER INSURANCE, BUT HER PART WAS GOING TO STILL BE AROUND 600$ SHE ASKED IF THERE WAS A COMPARABLE ALTERNATIVE THAT COULD BE SENT IN.  SHE CAN NOT AFFORD THE OTHER

## 2021-05-10 NOTE — Telephone Encounter (Signed)
Pt's pharmacy contacted [place] again regarding the pt needing something else she can take besides the [place]. Her cost is too high for to purchase. Please advise

## 2021-05-15 NOTE — Telephone Encounter (Signed)
Can we try and do patient assistance for this medication?

## 2021-05-16 NOTE — Telephone Encounter (Signed)
I will have the pt to fill out a Horticulturist, commercial with [facility] and return to [place] once she completes her part. I also give the pt instructions on what part she needs to fill out

## 2021-05-22 ENCOUNTER — Telehealth: Payer: Self-pay

## 2021-05-22 NOTE — Telephone Encounter (Signed)
Sent the pt a letter regarding the instructions to fill out paperwork for some assistance for Xifaxan. (Letter with instructions/application). A PA had already been done and approved which got the cost down to $600.00 but the pt still needed help with cost. Per Dr. Abbey Chatters to assist the pt in finding a company to help with cost. The [facility] Dept at this time will not take any new pt's due to shortage in staff.so this paperwork/application is for [facility].

## 2021-06-04 ENCOUNTER — Ambulatory Visit: Payer: Medicare Other | Admitting: Gastroenterology

## 2021-06-12 ENCOUNTER — Telehealth: Payer: Self-pay

## 2021-06-12 NOTE — Telephone Encounter (Signed)
Faxed referral,monthly income, insurance information and receipt of how much Xixifan cost to [employer] for the patient Garment/textile technologist.

## 2021-06-13 ENCOUNTER — Encounter: Payer: Self-pay | Admitting: Internal Medicine

## 2021-07-04 ENCOUNTER — Telehealth: Payer: Self-pay

## 2021-07-04 NOTE — Telephone Encounter (Signed)
Documentation from [facility] Patient Assistance Program needing a hand written Rx to be sent/faxed in and not a electronic one. This has been done and the pt has been notified.

## 2021-07-10 NOTE — Telephone Encounter (Signed)
Documentation from [facility]--- the HCP Approval Letter--Pt ID: [id number]. Eligible to receive medication at no cost through the program until November 02, 2021.  Will advise the pt of this and send copy in mail because she will have to contact for all ongoing refills.   Will give to Manuela Schwartz to send to scan.

## 2021-08-07 ENCOUNTER — Other Ambulatory Visit: Payer: Self-pay | Admitting: Internal Medicine

## 2021-08-07 ENCOUNTER — Encounter: Payer: Self-pay | Admitting: Internal Medicine

## 2021-08-07 ENCOUNTER — Ambulatory Visit: Payer: Medicare Other | Admitting: Internal Medicine

## 2021-08-07 ENCOUNTER — Other Ambulatory Visit: Payer: Self-pay

## 2021-08-07 VITALS — BP 150/67 | HR 65 | Temp 97.3°F | Ht 63.0 in | Wt 167.8 lb

## 2021-08-07 DIAGNOSIS — K2970 Gastritis, unspecified, without bleeding: Secondary | ICD-10-CM

## 2021-08-07 DIAGNOSIS — R1013 Epigastric pain: Secondary | ICD-10-CM | POA: Diagnosis not present

## 2021-08-07 DIAGNOSIS — R197 Diarrhea, unspecified: Secondary | ICD-10-CM | POA: Diagnosis not present

## 2021-08-07 DIAGNOSIS — K2990 Gastroduodenitis, unspecified, without bleeding: Secondary | ICD-10-CM

## 2021-08-07 MED ORDER — COLESTIPOL HCL 1 G PO TABS: 2.0000 g | ORAL_TABLET | Freq: Two times a day (BID) | ORAL | 3 refills | Status: AC

## 2021-08-07 NOTE — Patient Instructions (Addendum)
Continue on cholestyramine.  I will change you from powder to tablets.  You will need to take 2 tablets twice daily.  If symptoms not improved on twice daily dosing, then I would recommend you contact [facility] for another 2-week course.  I am going to order H. pylori breath testing to be done at [facility] labs.  You will need to hold your omeprazole for 14 days prior to undergoing this test.  Otherwise follow-up in 3 months.  It was very nice seeing you again today.  Dr. Abbey Chatters  At [facility] we value your feedback. You may receive a survey about your visit today. Please share your experience as we strive to create trusting relationships with our patients to provide genuine, compassionate, quality care.  We appreciate your understanding and patience as we review any laboratory studies, imaging, and other diagnostic tests that are ordered as we care for you. Our office policy is 5 business days for review of these results, and any emergent or urgent results are addressed in a timely manner for your best interest. If you do not hear from our office in 1 week, please contact us.   We also encourage the use of MyChart, which contains your medical information for your review as well. If you are not enrolled in this feature, an access code is on this after visit summary for your convenience. Thank you for allowing [place] to be involved in your care.  It was great to see you today!  I hope you have a great rest of your Fall!    Heidi Thompson. Abbey Chatters, D.O. Gastroenterology and Hepatology [facility]

## 2021-08-08 ENCOUNTER — Other Ambulatory Visit: Payer: Self-pay | Admitting: Internal Medicine

## 2021-08-08 NOTE — Progress Notes (Signed)
Referring Provider: [name], FNP Primary Care Physician:  [name], FNP Primary GI:  Dr. Abbey Chatters  Chief Complaint  Patient presents with   Diarrhea    Ok as long as takes med. Got 2 week supply of Xifaxin, abd pain was better   Bloated    HPI:   Heidi Thompson is a 75 y.o. female who presents to clinic today for follow-up visit.  She has a history of chronic diarrhea for years.  Notes associated symptoms of urgency and at times incontinence.  Sometimes has nocturnal BMs as well requiring her to get out of bed.  Patient's daughter and granddaughter have celiac disease.  Patient's sister has Crohn's disease.   Stool studies 10/08/2020: Stool culture negative for Salmonella/Shigella/Campylobacter/E. coli.  C. difficile toxin gene NAA negative, O&P exam negative.   Also has chronic reflux and abdominal pain. 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  Duodenal biopsies did show mild increase in intraepithelial lymphocytes though no significant villous blunting.  Patient was taking Mobic at the time   Colonoscopy 03/04/21 with biopsies negative for microscopic colitis.  3 tubular adenomas removed with a 5-year recall.   Patient is currently taking Questran.  She does note improvement though this medication is difficult for her to swallow. Previously on Creon which did not help her diarrhea.  Has trialed Imodium as well as dicyclomine without improvement.   She went on a strict gluten-free diet for 3 months without improvement in her symptoms.  She has reintroduced gluten back in her diet and has not noticed a difference.   On previous visit I recommended that she trial herself off of a few of her chronic medications which have been linked to diarrhea.  She stopped her Mobic without improvement. She went off her omeprazole for 3 days and states her heartburn was so severe she had to restart it.  Has not trialed stopping her metformin.  Does chronically take  Synthroid as well.  I trialed her on a 2-week course of Xifaxan.  She states this helped with her abdominal pain and bloating but as soon as she is finished her course the symptoms returned.  Does state her symptoms are mildly improved having 3-4 loose bowel movements daily.  Past Medical History:  Diagnosis Date   Acid reflux    Anxiety    Arthritis    spondylotic radiculopathy, neck, back, shoulder    Cancer ([place]) 1978   melanoma- on R leg, removed   Diabetes mellitus without complication ([place])    H/O total thyroidectomy 1982   Heart murmur    History of kidney stones    on imaging, saw 2 stones in kidney & pt. reports that she thinks she has passed one on her own at one time.    Hyperlipidemia    Hypertension    PONV (postoperative nausea and vomiting)     Past Surgical History:  Procedure Laterality Date   ABDOMINAL HYSTERECTOMY     ANTERIOR CERVICAL DECOMP/DISCECTOMY FUSION N/A 03/18/2017   Procedure: ACDF C4-7;  Surgeon: Melina Schools, MD;  Location: [place];  Service: Orthopedics;  Laterality: N/A;  Requests 4 hours   ANTERIOR LAT LUMBAR FUSION N/A 07/14/2018   Procedure: Anterior lateral lumbar fusion L2-4;  Surgeon: Melina Schools, MD;  Location: [place];  Service: Orthopedics;  Laterality: N/A;  7 hrs   APPENDECTOMY     BACK SURGERY     BIOPSY  03/04/2021   Procedure: BIOPSY;  Surgeon: Abbey Chatters,  Elon Alas, DO;  [facility];  Service: Endoscopy;;  Duodenal and gastric antrum and body, random colon biopsies    CHOLECYSTECTOMY     COLONOSCOPY WITH PROPOFOL N/A 03/04/2021   Procedure: COLONOSCOPY WITH PROPOFOL;  Surgeon: Eloise Harman, DO;  [facility];  Service: Endoscopy;  Laterality: N/A;  AM (diabetic)   ESOPHAGOGASTRODUODENOSCOPY (EGD) WITH PROPOFOL N/A 03/04/2021   Procedure: ESOPHAGOGASTRODUODENOSCOPY (EGD) WITH PROPOFOL;  Surgeon: Eloise Harman, DO;  [facility];  Service: Endoscopy;  Laterality: N/A;   EYE SURGERY Bilateral     cataracts /w IOL   JOINT REPLACEMENT Bilateral    knees   POLYPECTOMY  03/04/2021   Procedure: POLYPECTOMY;  Surgeon: Eloise Harman, DO;  [facility];  Service: Endoscopy;;  cecal polyp, descending colon polyp   SHOULDER ARTHROSCOPY Right 2016   SPINAL CORD STIMULATOR INSERTION N/A 05/02/2020   Procedure: SPINAL CORD STIMULATOR INSERTION;  Surgeon: Melina Schools, MD;  Location: [place];  Service: Orthopedics;  Laterality: N/A;  2.5 hrs   thumb joi     thumb Joint  Right    thumb joint On R hand replaced   TONSILLECTOMY     TOTAL THYROIDECTOMY      Current Outpatient Medications  Medication Sig Dispense Refill   atorvastatin (LIPITOR) 40 MG tablet Take 40 mg by mouth at bedtime.     carvedilol (COREG) 25 MG tablet Take 12.5 mg by mouth 2 (two) times daily.      chlorthalidone (HYGROTON) 25 MG tablet Take 25 mg by mouth daily.     Cholecalciferol (VITAMIN D3) 2000 units TABS Take 2,000 Units by mouth daily.     colestipol (COLESTID) 1 g tablet Take 2 tablets (2 g total) by mouth 2 (two) times daily. 360 tablet 3   escitalopram (LEXAPRO) 20 MG tablet Take 20 mg by mouth daily before breakfast.      levothyroxine (SYNTHROID, LEVOTHROID) 50 MCG tablet Take 50 mcg by mouth daily before breakfast. 2 hours after breakfast     LORazepam (ATIVAN) 1 MG tablet Take 1 mg by mouth 2 (two) times daily.     omeprazole (PRILOSEC OTC) 20 MG tablet Take 1 tablet (20 mg total) by mouth 2 (two) times daily before a meal. 60 tablet 5   Polyethyl Glycol-Propyl Glycol (SYSTANE OP) Place 1 drop into both eyes daily as needed (Dry eye).     potassium chloride SA (K-DUR,KLOR-CON) 20 MEQ tablet Take 20 mEq by mouth daily with lunch.      terbinafine (LAMISIL) 250 MG tablet Take 250 mg by mouth daily.     valsartan (DIOVAN) 80 MG tablet Take 80 mg by mouth daily.     ferrous sulfate 325 (65 FE) MG tablet Take 325 mg by mouth daily with breakfast. (Patient not taking: Reported on 08/07/2021)      lipase/protease/amylase (CREON) 36000 UNITS CPEP capsule Take 2 capsules with meals and 1 capsule with snacks. Take up to 8 per day. Take with first bite of food. (Patient not taking: Reported on 08/07/2021) 240 capsule 5   meloxicam (MOBIC) 15 MG tablet Take 15 mg by mouth daily. (Patient not taking: Reported on 08/07/2021)     metFORMIN (GLUCOPHAGE) 500 MG tablet Take 500 mg by mouth every evening. (Patient not taking: Reported on 08/07/2021)     Multiple Vitamins-Minerals (MULTIVITAMIN WITH MINERALS) tablet Take 1 tablet by mouth daily. (Patient not taking: No sig reported)     polyethylene glycol-electrolytes (  TRILYTE) 420 g solution Take 4,000 mLs by mouth as directed. (Patient not taking: No sig reported) 4000 mL 0   sucralfate (CARAFATE) 1 g tablet Take 1 tablet (1 g total) by mouth 4 (four) times daily. (Patient not taking: No sig reported) 120 tablet 1   No current facility-administered medications for this visit.    Allergies as of 08/07/2021 - Review Complete 08/07/2021  Allergen Reaction Noted   Azithromycin  04/23/2020    Family History  Problem Relation Age of Onset   Crohn's disease Sister    Celiac disease Daughter    Colon cancer Neg Hx     Social History   Socioeconomic History   Marital status: Married    Spouse name: Not on file   Number of children: Not on file   Years of education: Not on file   Highest education level: Not on file  Occupational History   Not on file  Tobacco Use   Smoking status: Never   Smokeless tobacco: Never  Vaping Use   Vaping Use: Never used  Substance and Sexual Activity   Alcohol use: No   Drug use: No   Sexual activity: Not on file  Other Topics Concern   Not on file  Social History Narrative   Not on file   Social Determinants of Health   Financial Resource Strain: Not on file  Food Insecurity: Not on file  Transportation Needs: Not on file  Physical Activity: Not on file  Stress: Not on file  Social Connections: Not  on file    Subjective: Review of Systems  Constitutional:  Negative for chills and fever.  HENT:  Negative for congestion and hearing loss.   Eyes:  Negative for blurred vision and double vision.  Respiratory:  Negative for cough and shortness of breath.   Cardiovascular:  Negative for chest pain and palpitations.  Gastrointestinal:  Positive for diarrhea. Negative for abdominal pain, blood in stool, constipation, heartburn, melena and vomiting.  Genitourinary:  Negative for dysuria and urgency.  Musculoskeletal:  Negative for joint pain and myalgias.  Skin:  Negative for itching and rash.  Neurological:  Negative for dizziness and headaches.  Psychiatric/Behavioral:  Negative for depression. The patient is not nervous/anxious.     Objective: BP (!) 150/67   Pulse 65   Temp (!) 97.3 F (36.3 C) (Temporal)   Ht 5\' 3"  (1.6 m)   Wt 167 lb 12.8 oz (76.1 kg)   BMI 29.72 kg/m  Physical Exam Constitutional:      Appearance: Normal appearance.  HENT:     Head: Normocephalic and atraumatic.  Eyes:     Extraocular Movements: Extraocular movements intact.     Conjunctiva/sclera: Conjunctivae normal.  Cardiovascular:     Rate and Rhythm: Normal rate and regular rhythm.     Heart sounds: Murmur heard.  Pulmonary:     Effort: Pulmonary effort is normal.     Breath sounds: Normal breath sounds.  Abdominal:     General: Bowel sounds are normal.     Palpations: Abdomen is soft.  Musculoskeletal:        General: No swelling. Normal range of motion.     Cervical back: Normal range of motion and neck supple.  Skin:    General: Skin is warm and dry.     Coloration: Skin is not jaundiced.  Neurological:     General: No focal deficit present.     Mental Status: She is alert and oriented to person,  place, and time.  Psychiatric:        Mood and Affect: Mood normal.        Behavior: Behavior normal.     Assessment: *Diarrhea-chronic *Abdominal pain *GERD-relatively well  controlled on omeprazole twice daily   Plan: Etiology of patient's chronic diarrhea remains unclear.  She does note improvement on Questran as long she takes it though difficult to tolerate the powder.  I will switch her to colestipol 2 g twice daily.  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  She has trialed and failed Imodium, dicyclomine, Creon.   Taking a closer look at her medication list she does take Mobic, metformin, omeprazole, all of which have been linked to chronic diarrhea.  Also takes Synthroid but will be difficult for her to stop this.  She does state her TSH is monitored regularly.  She trialed off of Mobic and omeprazole and her diarrhea did not improve.   I will send in another 2-week course of Xifaxan today and monitor for improvement.    FODMAP diet printed out previously.  She states that she has identified some triggers based on this.  Given lymphocytes on duodenal biopsies, we will check H. pylori breath testing.  Likely this finding was due to her chronic Mobic use however.   Follow-up in 3 months   08/08/2021 1:13 PM   Disclaimer: This note was dictated with voice recognition software. Similar sounding words can inadvertently be transcribed and may not be corrected upon review.

## 2021-08-22 ENCOUNTER — Other Ambulatory Visit (HOSPITAL_COMMUNITY)
Admission: RE | Admit: 2021-08-22 | Discharge: 2021-08-22 | Disposition: A | Discharge status: Home / Self Care | Payer: Medicare Other | Source: Ambulatory Visit | Attending: Internal Medicine | Admitting: Internal Medicine

## 2021-08-22 ENCOUNTER — Other Ambulatory Visit: Payer: Self-pay

## 2021-08-26 LAB — H. PYLORI BREATH TEST: H. pylori Breath Test: NOT DETECTED

## 2021-11-06 ENCOUNTER — Other Ambulatory Visit: Payer: Self-pay

## 2021-11-06 ENCOUNTER — Ambulatory Visit: Payer: Medicare Other | Admitting: Gastroenterology

## 2021-11-06 ENCOUNTER — Telehealth: Payer: Self-pay

## 2021-11-06 ENCOUNTER — Encounter: Payer: Self-pay | Admitting: Gastroenterology

## 2021-11-06 VITALS — BP 137/61 | HR 63 | Temp 97.5°F | Ht 63.0 in | Wt 168.4 lb

## 2021-11-06 DIAGNOSIS — R140 Abdominal distension (gaseous): Secondary | ICD-10-CM | POA: Insufficient documentation

## 2021-11-06 DIAGNOSIS — K219 Gastro-esophageal reflux disease without esophagitis: Secondary | ICD-10-CM | POA: Diagnosis not present

## 2021-11-06 DIAGNOSIS — K529 Noninfective gastroenteritis and colitis, unspecified: Secondary | ICD-10-CM

## 2021-11-06 DIAGNOSIS — R109 Unspecified abdominal pain: Secondary | ICD-10-CM | POA: Diagnosis not present

## 2021-11-06 DIAGNOSIS — R197 Diarrhea, unspecified: Secondary | ICD-10-CM

## 2021-11-06 MED ORDER — PANCRELIPASE (LIP-PROT-AMYL) 36000-114000 UNITS PO CPEP: ORAL_CAPSULE | ORAL | 5 refills | Status: AC

## 2021-11-06 NOTE — Patient Instructions (Signed)
CT scan of your abdomen as scheduled. You will need to pick up contrast AND have labs done at least two days before your CT. You can have your labs done at [facility] today when you go pick up your contrast. If you are unavailable to get your labs done today due to time constraints, you can have them done in [place], but please let us know that is your plan. Continue Colestid as before.  Start back on Creon two capsules with first bite of breakfast, lunch, and supper. Take one capsule with snacks. Up to 8 per day. RX sent to [facility].

## 2021-11-06 NOTE — Progress Notes (Signed)
Primary Care Physician: [name], FNP  Primary Gastroenterologist:  Elon Alas. Abbey Chatters, DO   Chief Complaint  Patient presents with   Abdominal Pain    Comes/goes, upper abd   Diarrhea    Improving but still on going, usually daily at least 1 episode of all liquid stool   Nausea    Comes/goes    HPI: Heidi Thompson is a 76 y.o. female here for follow-up.  Last seen in October for chronic diarrhea.  She has had symptoms for years.  Symptoms associated with urgency and at times incontinence.  Sometimes nocturnal symptoms.  Patient's daughter and granddaughter both have celiac disease.  Her sister has Crohn's disease.  Stool studies December 2021 were negative.  In March 2022, TTG IgA was normal but IgA level was slightly low.  I recommended her to have TTG IgG done but this was never completed.  Sed rate and CRP normal.  Fecal pancreatic elastase was low.  Colonoscopy May 2022 with biopsies negative for microscopic colitis.  She had 3 tubular adenomas removed, 5-year recall.  Currently taking colestid.  Previously Creon, dicyclomine, Imodium did not help.  2-week course of Xifaxan helped with abdominal pain and bloating in the past but symptoms returned shortly afterwards.  She has been on a strict gluten-free diet in the past without improvement in her symptoms, reintroduction of gluten did not make any difference. She was on gluten when she had completed EGD with small bowel biopsies. Previously stopped Mobic but no difference in diarrhea. Remains on metformin.   She also has chronic reflux and abdominal pain.  EGD completed in May 2022 showed H. pylori negative gastritis.  Duodenal biopsies did show a mild increase in intraepithelial lymphocytes though no significant villous blunting.  She was on Mobic at the time.  Was given an additional 2-week trial of Xifaxan after last office visit.  She also had negative H. pylori breath test back in October.  Today: typically has one  large loose stool each day and sometimes after that will pass semiform stool. Still having abdominal pain several times per week. Does not seem to be related to BMs or any particular foods. Stools are dark but no melena, brbpr. Rarely having nocturnal stools or fecal incontinence. Concerned about a swelling in the upper abdomen that she has noted for several months. She wonders about trying Creon again given previously low fecal elastase.    Current Outpatient Medications  Medication Sig Dispense Refill   atorvastatin (LIPITOR) 40 MG tablet Take 40 mg by mouth at bedtime.     carvedilol (COREG) 25 MG tablet Take 12.5 mg by mouth 2 (two) times daily.      chlorthalidone (HYGROTON) 25 MG tablet Take 25 mg by mouth daily.     Cholecalciferol (VITAMIN D3) 2000 units TABS Take 2,000 Units by mouth daily.     colestipol (COLESTID) 1 g tablet Take 2 tablets (2 g total) by mouth 2 (two) times daily. 360 tablet 3   escitalopram (LEXAPRO) 20 MG tablet Take 20 mg by mouth daily before breakfast.      levothyroxine (SYNTHROID, LEVOTHROID) 50 MCG tablet Take 50 mcg by mouth daily before breakfast. 2 hours after breakfast     LORazepam (ATIVAN) 1 MG tablet Take 1 mg by mouth 2 (two) times daily.     metFORMIN (GLUCOPHAGE) 500 MG tablet Take 500 mg by mouth daily with supper.     omeprazole (PRILOSEC OTC) 20 MG tablet Take 1  tablet (20 mg total) by mouth 2 (two) times daily before a meal. 60 tablet 5   Polyethyl Glycol-Propyl Glycol (SYSTANE OP) Place 1 drop into both eyes daily as needed (Dry eye).     potassium chloride SA (K-DUR,KLOR-CON) 20 MEQ tablet Take 20 mEq by mouth daily with lunch.      valsartan (DIOVAN) 80 MG tablet Take 80 mg by mouth daily.     No current facility-administered medications for this visit.    Allergies as of 11/06/2021 - Review Complete 11/06/2021  Allergen Reaction Noted   Azithromycin  04/23/2020    ROS:  General: Negative for anorexia, weight loss, fever, chills,  fatigue, weakness. ENT: Negative for hoarseness, difficulty swallowing , nasal congestion. CV: Negative for chest pain, angina, palpitations, dyspnea on exertion, peripheral edema.  Respiratory: Negative for dyspnea at rest, dyspnea on exertion, cough, sputum, wheezing.  GI: See history of present illness. GU:  Negative for dysuria, hematuria, urinary incontinence, urinary frequency, nocturnal urination.  Endo: Negative for unusual weight change.    Physical Examination:   BP 137/61    Pulse 63    Temp (!) 97.5 F (36.4 C)    Ht 5\' 3"  (1.6 m)    Wt 168 lb 6.4 oz (76.4 kg)    BMI 29.83 kg/m   General: Well-nourished, well-developed in no acute distress.  Eyes: No icterus. Mouth: masked Lungs: Clear to auscultation bilaterally.  Heart: Regular rate and rhythm, no murmurs rubs or gallops.  Abdomen: Bowel sounds are normal. She has fist sized fullness in the epigastric region. No discrete mass, rectus diastasis, or hernia. Moderate RUQ tenderness. no hepatosplenomegaly or masses, no abdominal bruits or hernia , no rebound or guarding.   Extremities: No lower extremity edema. No clubbing or deformities. Neuro: Alert and oriented x 4   Skin: Warm and dry, no jaundice.   Psych: Alert and cooperative, normal mood and affect.  Labs:  Lab Results  Component Value Date   CREATININE 0.91 03/01/2021   BUN 15 03/01/2021   NA 138 03/01/2021   K 3.7 03/01/2021   CL 101 03/01/2021   CO2 29 03/01/2021   No results found for: ALT, AST, GGT, ALKPHOS, BILITOT Lab Results  Component Value Date   WBC 7.5 04/27/2020   HGB 12.7 04/27/2020   HCT 38.8 04/27/2020   MCV 93.7 04/27/2020   PLT 281 04/27/2020     Imaging Studies: No results found.   Assessment:  Chronic diarrhea: FH celiac and Crohn's. Colonoscopy 2022 negative for microscopic colitis. TTG IgA normal but IgA level slightly low. Duodenal biopsies with mild increase in intraepithelial lymphocytes but no significant villous blunting.  Celiac disease less likely but will TTG IgG given low serum IgA level before. Fecal pancreatic elastase was low. Somewhat improved on colestid but stools remain loose. Less incontinence/urgency/frequency. 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   Chronic GERD: well controlled.   Right sided abdominal pain/distention in the upper abdomen: etiology unclear. I do not appreciate abdominal mass/hernia. Given progressive symptoms, would recommend CT imaging.   Plan: CT A/P with contrast to evaluate abdominal pain/distention.  BMET, TTG IgG/IgA Continue colestid as before.  Resume Creon 2 capsules with meals and 1 capsule with snacks.

## 2021-11-06 NOTE — Telephone Encounter (Signed)
PA for CT abd/pelvis w/contrast submitted via [employer] website. Case "adheres". Medicare decision support#: [id number] [place].

## 2021-11-12 ENCOUNTER — Other Ambulatory Visit (HOSPITAL_COMMUNITY)
Admission: RE | Admit: 2021-11-12 | Discharge: 2021-11-12 | Disposition: A | Discharge status: Home / Self Care | Payer: Medicare Other | Source: Ambulatory Visit | Attending: Gastroenterology | Admitting: Gastroenterology

## 2021-11-12 DIAGNOSIS — R197 Diarrhea, unspecified: Secondary | ICD-10-CM | POA: Insufficient documentation

## 2021-11-12 DIAGNOSIS — K219 Gastro-esophageal reflux disease without esophagitis: Secondary | ICD-10-CM | POA: Insufficient documentation

## 2021-11-12 DIAGNOSIS — R140 Abdominal distension (gaseous): Secondary | ICD-10-CM | POA: Insufficient documentation

## 2021-11-12 DIAGNOSIS — K529 Noninfective gastroenteritis and colitis, unspecified: Secondary | ICD-10-CM | POA: Diagnosis present

## 2021-11-12 DIAGNOSIS — R109 Unspecified abdominal pain: Secondary | ICD-10-CM | POA: Diagnosis present

## 2021-11-12 LAB — BASIC METABOLIC PANEL
Anion gap: 9 (ref 5–15)
BUN: 14 mg/dL (ref 8–23)
CO2: 27 mmol/L (ref 22–32)
Calcium: 9.4 mg/dL (ref 8.9–10.3)
Chloride: 104 mmol/L (ref 98–111)
Creatinine, Ser: 0.95 mg/dL (ref 0.44–1.00)
GFR, Estimated: >60 mL/min (ref >60)
Glucose, Bld: 112 mg/dL — ABNORMAL HIGH (ref 70–99)
Potassium: 3.7 mmol/L (ref 3.5–5.1)
Sodium: 140 mmol/L (ref 135–145)

## 2021-11-14 LAB — TISSUE TRANSGLUTAMINASE, IGA: Tissue Transglutaminase Ab, IgA: <2 U/mL (ref 0–3)

## 2021-11-25 ENCOUNTER — Other Ambulatory Visit: Payer: Self-pay

## 2021-11-25 DIAGNOSIS — K580 Irritable bowel syndrome with diarrhea: Secondary | ICD-10-CM

## 2021-11-25 DIAGNOSIS — K2970 Gastritis, unspecified, without bleeding: Secondary | ICD-10-CM

## 2021-11-25 DIAGNOSIS — R1013 Epigastric pain: Secondary | ICD-10-CM

## 2021-11-25 DIAGNOSIS — K2990 Gastroduodenitis, unspecified, without bleeding: Secondary | ICD-10-CM

## 2021-11-26 ENCOUNTER — Encounter: Payer: Self-pay | Admitting: Internal Medicine

## 2021-11-27 ENCOUNTER — Ambulatory Visit (HOSPITAL_COMMUNITY)
Admission: RE | Admit: 2021-11-27 | Discharge: 2021-11-27 | Disposition: A | Discharge status: Home / Self Care | Payer: Medicare Other | Source: Ambulatory Visit | Attending: Gastroenterology | Admitting: Gastroenterology

## 2021-11-27 ENCOUNTER — Other Ambulatory Visit (HOSPITAL_COMMUNITY)
Admission: RE | Admit: 2021-11-27 | Discharge: 2021-11-27 | Disposition: A | Discharge status: Home / Self Care | Payer: Medicare Other | Source: Ambulatory Visit | Attending: Gastroenterology | Admitting: Gastroenterology

## 2021-11-27 ENCOUNTER — Other Ambulatory Visit: Payer: Self-pay

## 2021-11-27 DIAGNOSIS — I700 Atherosclerosis of aorta: Secondary | ICD-10-CM | POA: Insufficient documentation

## 2021-11-27 DIAGNOSIS — K2970 Gastritis, unspecified, without bleeding: Secondary | ICD-10-CM | POA: Insufficient documentation

## 2021-11-27 DIAGNOSIS — R197 Diarrhea, unspecified: Secondary | ICD-10-CM | POA: Diagnosis present

## 2021-11-27 DIAGNOSIS — R109 Unspecified abdominal pain: Secondary | ICD-10-CM | POA: Insufficient documentation

## 2021-11-27 MED ORDER — IOHEXOL 300 MG/ML  SOLN
100.0000 mL | Freq: Once | INTRAMUSCULAR | Status: AC | PRN
  Administered 2021-11-27: 08:00:00 100 mL via INTRAVENOUS

## 2021-11-28 LAB — TISSUE TRANSGLUTAMINASE, IGG: Tissue Transglut Ab: <2 U/mL (ref 0–5)

## 2021-11-28 LAB — TISSUE TRANSGLUTAMINASE, IGA: Tissue Transglutaminase Ab, IgA: <2 U/mL (ref 0–3)

## 2021-12-02 ENCOUNTER — Telehealth: Payer: Self-pay | Admitting: Internal Medicine

## 2021-12-02 NOTE — Telephone Encounter (Signed)
Pt is wanting to know the results to her recent labs and ct. Told pt once Magda Paganini had a chance to review them we would be calling her. Pt verbalized understanding.

## 2021-12-02 NOTE — Telephone Encounter (Signed)
PLEASE CALL PATIENT WHEN HER RESULTS ARE COMPLETE FROM HER PROCEDURE

## 2021-12-02 NOTE — Telephone Encounter (Signed)
Noted  

## 2021-12-02 NOTE — Telephone Encounter (Signed)
See result note.  

## 2021-12-04 ENCOUNTER — Telehealth: Payer: Self-pay

## 2021-12-04 NOTE — Telephone Encounter (Signed)
Pt called stating that the Creon is not working and is also wanting to switch from omeprazole to pantoprazole. Pt is also wanting to know what else can be done about her abdominal pain and diarrhea. Pt states that she goes three to four times a day.

## 2021-12-05 MED ORDER — PANTOPRAZOLE SODIUM 40 MG PO TBEC: DELAYED_RELEASE_TABLET | ORAL | 3 refills | Status: DC

## 2021-12-05 NOTE — Telephone Encounter (Signed)
Sent rx for pantoprazole. Stop omeprazole once she picks up new rx.  Sounds like number of stools have picked up since her ov earlier last month. If she is willing to hold her metformin for for 10 days to see if her abdominal pain and number of stools improve, to make sure symptoms are not related to medication. I would like for her to try that.   If she is not willing, then we could cycle another round of xifaxan. Continue Creon for now. If not better after taking medication consistent for 8 weeks then she can stop.

## 2021-12-05 NOTE — Telephone Encounter (Signed)
Noted  

## 2021-12-05 NOTE — Addendum Note (Signed)
Addended by: Mahala Menghini on: 12/05/2021 03:53 PM   Modules accepted: Orders

## 2021-12-05 NOTE — Telephone Encounter (Signed)
Pt was made aware and states that she will try not taking metformin as recommended. Pt will call us after 10 days to let us know how that went.

## 2022-01-04 IMAGING — RF DG C-ARM 1-60 MIN
1 series · 3 of 3 positions shown · non-contrast
Comparison: None.

CLINICAL DATA: Spinal cord stimulator placement

EXAM:
DG C-ARM 1-60 MIN; THORACOLUMBAR SPINE - 2 VIEW

[Series 1: run · 3 of 3 slices shown]
[im 1/3]
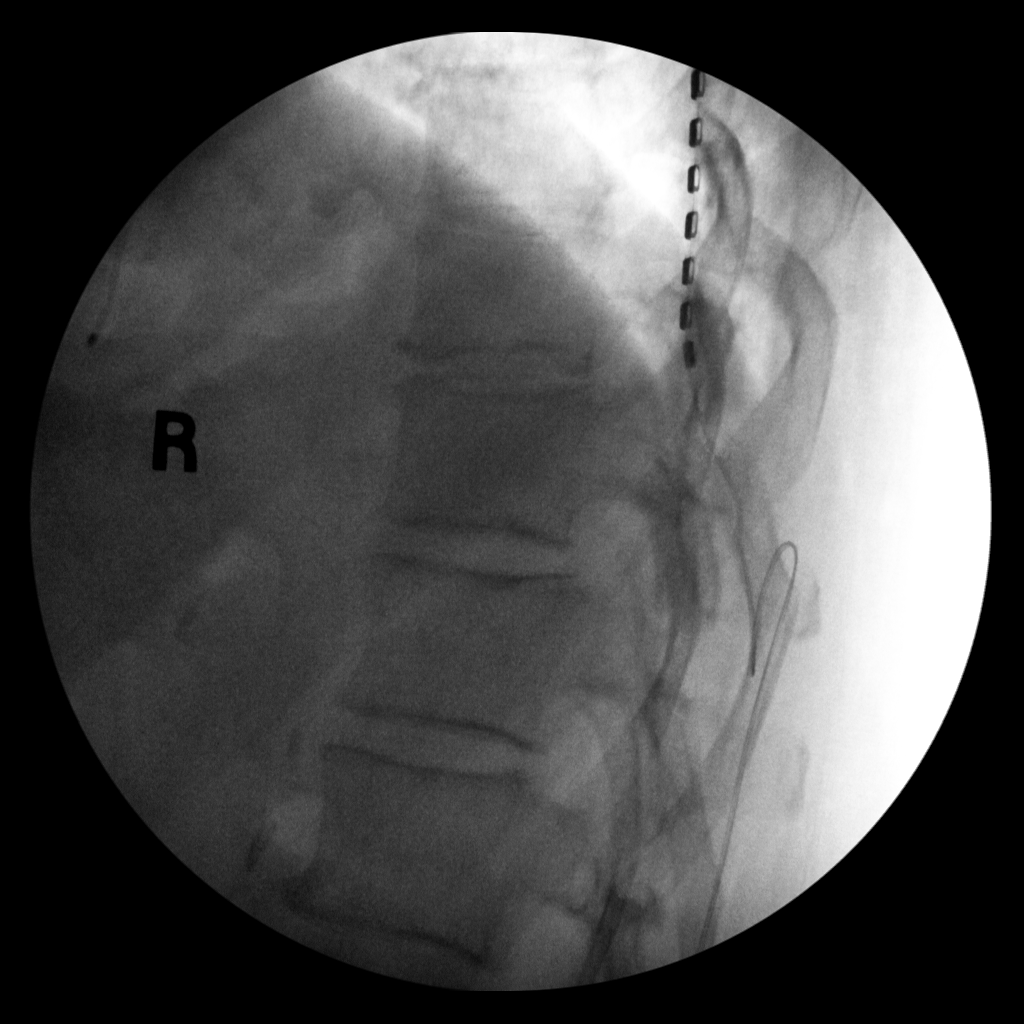
[im 2/3]
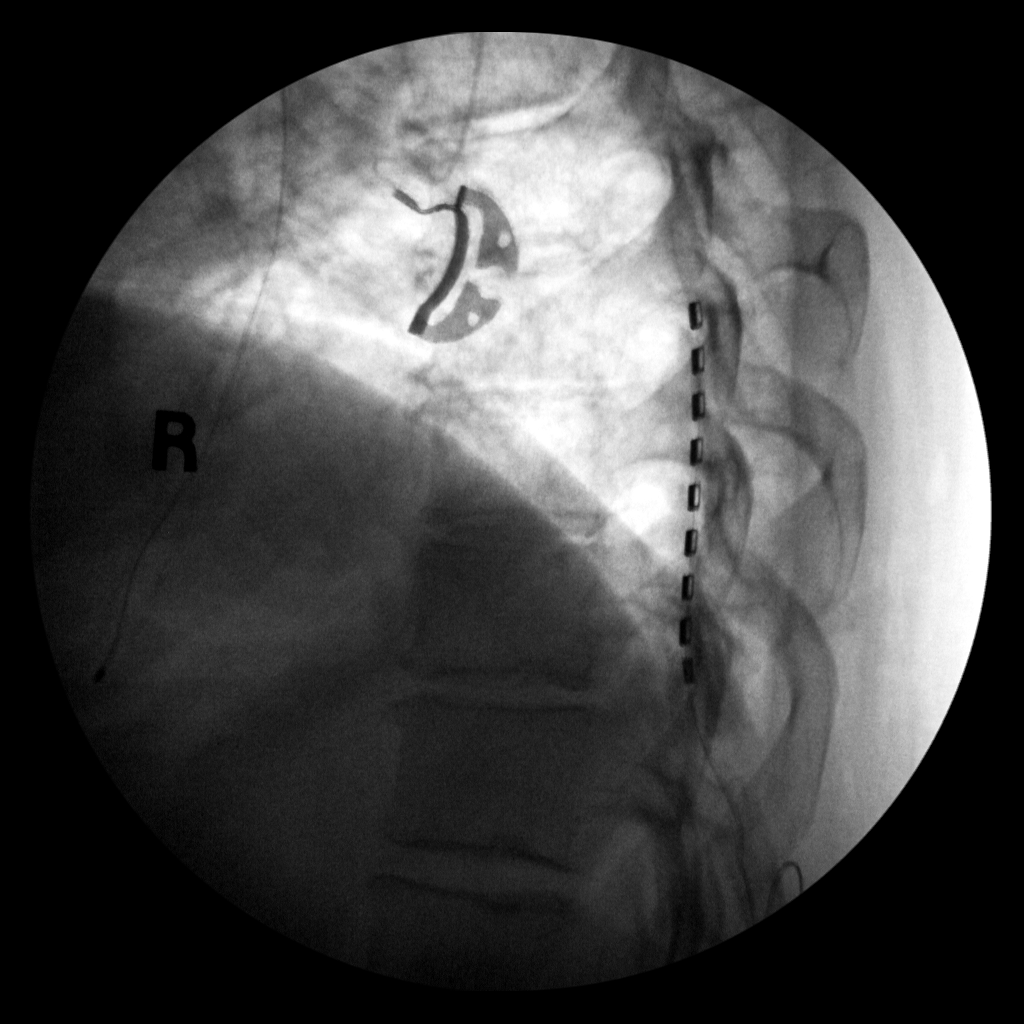
[im 3/3]
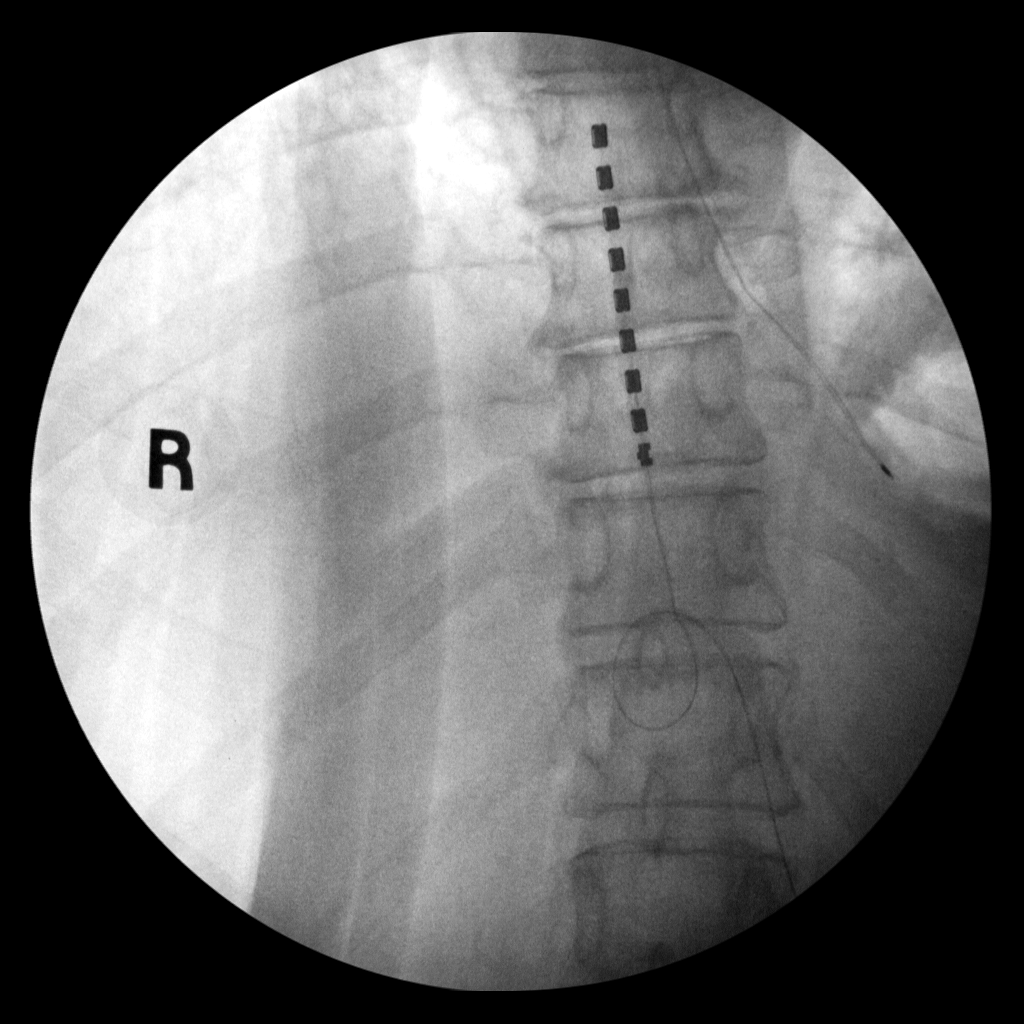

[3 of 3 positions shown; findings below may reference images not displayed]

FLUOROSCOPY TIME:  Fluoroscopy Time:  1 minutes 2 seconds

Radiation Exposure Index (if provided by the fluoroscopic device):
46.29 mGy

Number of Acquired Spot Images: 3
FINDINGS: Initial images demonstrate a wire extending along the posterior soft
tissues of the back with spine stimulator lead extending into the
lower thoracic spine.
IMPRESSION: Imaging for spinal stimulator placement over the lower thoracic
spine.

## 2022-01-15 ENCOUNTER — Encounter: Payer: Self-pay | Admitting: Internal Medicine

## 2022-01-15 ENCOUNTER — Other Ambulatory Visit: Payer: Self-pay

## 2022-01-15 ENCOUNTER — Ambulatory Visit: Payer: Medicare Other | Admitting: Internal Medicine

## 2022-01-15 VITALS — BP 130/62 | HR 59 | Temp 97.1°F | Ht 63.0 in | Wt 169.6 lb

## 2022-01-15 DIAGNOSIS — K529 Noninfective gastroenteritis and colitis, unspecified: Secondary | ICD-10-CM

## 2022-01-15 DIAGNOSIS — K219 Gastro-esophageal reflux disease without esophagitis: Secondary | ICD-10-CM

## 2022-01-15 DIAGNOSIS — R1013 Epigastric pain: Secondary | ICD-10-CM | POA: Diagnosis not present

## 2022-01-15 MED ORDER — SUCRALFATE 1 G PO TABS: 1.0000 g | ORAL_TABLET | Freq: Four times a day (QID) | ORAL | 1 refills | Status: AC

## 2022-01-15 NOTE — Patient Instructions (Signed)
For your chronic diarrhea, I want you to start taking Imodium 1 tablet with every meal.  Call and let us know in a couple weeks if you are not improved and we will send in prescription for Lomotil. ? ?Continue on pantoprazole for your chronic reflux.  I will send in Carafate to take up to 4 times a day as needed for abdominal pain. ? ?Otherwise follow-up in 2 to 3 months. ? ?It was very nice seeing you again today. ? ?Dr. Abbey Chatters ? ?At [facility] we value your feedback. You may receive a survey about your visit today. Please share your experience as we strive to create trusting relationships with our patients to provide genuine, compassionate, quality care. ? ?We appreciate your understanding and patience as we review any laboratory studies, imaging, and other diagnostic tests that are ordered as we care for you. Our office policy is 5 business days for review of these results, and any emergent or urgent results are addressed in a timely manner for your best interest. If you do not hear from our office in 1 week, please contact us.  ? ?We also encourage the use of MyChart, which contains your medical information for your review as well. If you are not enrolled in this feature, an access code is on this after visit summary for your convenience. Thank you for allowing [place] to be involved in your care. ? ?It was great to see you today!  I hope you have a great rest of your Winter! ? ? ? ?Elon Alas. Abbey Chatters, D.O. ?Gastroenterology and Hepatology ?[facility] ? ?

## 2022-01-15 NOTE — Progress Notes (Signed)
? ? ?Referring Provider: Elby Beck, DO ?Primary Care Physician:  Elby Beck, DO ?Primary GI:  Dr. Abbey Chatters ? ?Chief Complaint  ?Patient presents with  ? Follow-up  ?  Still having abdominal pain in the middle of her stomach, bloating and chronic diarrhea  ? ? ?HPI:   ?Heidi Thompson is a 76 y.o. female who presents to clinic today for follow-up visit.  She has a history of chronic diarrhea for years.  Notes associated symptoms of urgency and at times incontinence.  Sometimes has nocturnal BMs as well requiring her to get out of bed.  Patient's daughter and granddaughter have celiac disease.  Patient's sister has Crohn's disease. ?  ?Stool studies 10/08/2020: Stool culture negative for Salmonella/Shigella/Campylobacter/E. coli.  C. difficile toxin gene NAA negative, O&P exam negative. ?  ?Also has chronic reflux and abdominal pain. 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  Duodenal biopsies did show mild increase in intraepithelial lymphocytes though no significant villous blunting.  Patient was taking Mobic at the time ?  ?Colonoscopy 03/04/21 with biopsies negative for microscopic colitis.  3 tubular adenomas removed with a 5-year recall. ?  ?Previously on Questran which helps some she thought though she discontinued this medication.  Has not noticed a difference since stopping. ? ?Was also trialed on Creon given mildly low fecal elastase.  No improvement in symptoms so she stopped this medication. ? ?Has also tried dicyclomine without improvement.  Currently taking Imodium 3-4 times a week which she states helps some. ?  ?She went on a strict gluten-free diet for 3 months without improvement in her symptoms.  She has reintroduced gluten back in her diet and has not noticed a difference.  ? ?On previous visit I recommended that she trial herself off of a few of her chronic medications which have been linked to diarrhea.  She stopped her Mobic without improvement.  ? ?Recently switched from  omeprazole to pantoprazole.  Reflux well controlled.  Has noted slight formation in stools at times. ? ?Has completed two 2-week courses of Xifaxan. ? ?CT abdomen pelvis with contrast 11/27/2021 unremarkable besides possible proximal gastric wall thickening related to underdistention versus gastritis.   ? ?Does state her symptoms are mildly improved having 3-4 loose bowel movements daily. ? ?Past Medical History:  ?Diagnosis Date  ? Acid reflux   ? Anxiety   ? Arthritis   ? spondylotic radiculopathy, neck, back, shoulder   ? Cancer [facility]) 1978  ? melanoma- on R leg, removed  ? Diabetes mellitus without complication ([place])   ? H/O total thyroidectomy 1982  ? Heart murmur   ? History of kidney stones   ? on imaging, saw 2 stones in kidney & pt. reports that she thinks she has passed one on her own at one time.   ? Hyperlipidemia   ? Hypertension   ? PONV (postoperative nausea and vomiting)   ? ? ?Past Surgical History:  ?Procedure Laterality Date  ? ABDOMINAL HYSTERECTOMY    ? ANTERIOR CERVICAL DECOMP/DISCECTOMY FUSION N/A 03/18/2017  ? Procedure: ACDF C4-7;  Surgeon: Melina Schools, MD;  Location: [place];  Service: Orthopedics;  Laterality: N/A;  Requests 4 hours  ? ANTERIOR LAT LUMBAR FUSION N/A 07/14/2018  ? Procedure: Anterior lateral lumbar fusion L2-4;  Surgeon: Melina Schools, MD;  Location: [place];  Service: Orthopedics;  Laterality: N/A;  7 hrs  ? APPENDECTOMY    ? BACK SURGERY    ? BIOPSY  03/04/2021  ? Procedure:  BIOPSY;  Surgeon: Eloise Harman, DO;  [facility];  Service: Endoscopy;;  Duodenal and gastric antrum and body, random colon biopsies   ? CHOLECYSTECTOMY    ? COLONOSCOPY WITH PROPOFOL N/A 03/04/2021  ? Procedure: COLONOSCOPY WITH PROPOFOL;  Surgeon: Eloise Harman, DO;  [facility];  Service: Endoscopy;  Laterality: N/A;  AM (diabetic)  ? ESOPHAGOGASTRODUODENOSCOPY (EGD) WITH PROPOFOL N/A 03/04/2021  ? Procedure: ESOPHAGOGASTRODUODENOSCOPY (EGD) WITH PROPOFOL;  Surgeon: Eloise Harman, DO;  [facility];  Service: Endoscopy;  Laterality: N/A;  ? EYE SURGERY Bilateral   ? cataracts /w IOL  ? JOINT REPLACEMENT Bilateral   ? knees  ? POLYPECTOMY  03/04/2021  ? Procedure: POLYPECTOMY;  Surgeon: Eloise Harman, DO;  [facility];  Service: Endoscopy;;  cecal polyp, descending colon polyp  ? SHOULDER ARTHROSCOPY Right 2016  ? SPINAL CORD STIMULATOR INSERTION N/A 05/02/2020  ? Procedure: SPINAL CORD STIMULATOR INSERTION;  Surgeon: Melina Schools, MD;  Location: [place];  Service: Orthopedics;  Laterality: N/A;  2.5 hrs  ? thumb joi    ? thumb Joint  Right   ? thumb joint On R hand replaced  ? TONSILLECTOMY    ? TOTAL THYROIDECTOMY    ? ? ?Current Outpatient Medications  ?Medication Sig Dispense Refill  ? atorvastatin (LIPITOR) 40 MG tablet Take 40 mg by mouth at bedtime.    ? carvedilol (COREG) 25 MG tablet Take 12.5 mg by mouth 2 (two) times daily.     ? chlorthalidone (HYGROTON) 25 MG tablet Take 25 mg by mouth daily.    ? Cholecalciferol (VITAMIN D3) 2000 units TABS Take 2,000 Units by mouth daily.    ? escitalopram (LEXAPRO) 20 MG tablet Take 20 mg by mouth daily before breakfast.     ? levothyroxine (SYNTHROID, LEVOTHROID) 50 MCG tablet Take 50 mcg by mouth daily before breakfast. 2 hours after breakfast    ? LORazepam (ATIVAN) 1 MG tablet Take 1 mg by mouth 2 (two) times daily.    ? pantoprazole (PROTONIX) 40 MG tablet Take '40mg'$  po twice daily before a meal for 8 weeks, then once daily. 180 tablet 3  ? Polyethyl Glycol-Propyl Glycol (SYSTANE OP) Place 1 drop into both eyes daily as needed (Dry eye).    ? potassium chloride SA (K-DUR,KLOR-CON) 20 MEQ tablet Take 20 mEq by mouth daily with lunch.     ? valsartan (DIOVAN) 80 MG tablet Take 80 mg by mouth daily.    ? colestipol (COLESTID) 1 g tablet Take 2 tablets (2 g total) by mouth 2 (two) times daily. (Patient not taking: Reported on 01/15/2022) 360 tablet 3  ? lipase/protease/amylase (CREON) 36000 UNITS CPEP capsule  Take two capsules with first bite of breakfast, lunch, and supper. Take 1 capsule with first bite of snacks. Up to 8 capsules per day. (Patient not taking: Reported on 01/15/2022) 240 capsule 5  ? metFORMIN (GLUCOPHAGE) 500 MG tablet Take 500 mg by mouth daily with supper. (Patient not taking: Reported on 01/15/2022)    ? ?No current facility-administered medications for this visit.  ? ? ?Allergies as of 01/15/2022 - Review Complete 01/15/2022  ?Allergen Reaction Noted  ? Azithromycin  04/23/2020  ? ? ?Family History  ?Problem Relation Age of Onset  ? Crohn's disease Sister   ? Celiac disease Daughter   ? Colon cancer Neg Hx   ? ? ?Social History  ? ?Socioeconomic History  ? Marital status: Married  ?  Spouse name: Not on file  ? Number of children: Not on file  ? Years of education: Not on file  ? Highest education level: Not on file  ?Occupational History  ? Not on file  ?Tobacco Use  ? Smoking status: Never  ? Smokeless tobacco: Never  ?Vaping Use  ? Vaping Use: Never used  ?Substance and Sexual Activity  ? Alcohol use: No  ? Drug use: No  ? Sexual activity: Not Currently  ?  Partners: Male  ?  Comment: spouse  ?Other Topics Concern  ? Not on file  ?Social History Narrative  ? Not on file  ? ?Social Determinants of Health  ? ?Financial Resource Strain: Not on file  ?Food Insecurity: Not on file  ?Transportation Needs: Not on file  ?Physical Activity: Not on file  ?Stress: Not on file  ?Social Connections: Not on file  ? ? ?Subjective: ?Review of Systems  ?Constitutional:  Negative for chills and fever.  ?HENT:  Negative for congestion and hearing loss.   ?Eyes:  Negative for blurred vision and double vision.  ?Respiratory:  Negative for cough and shortness of breath.   ?Cardiovascular:  Negative for chest pain and palpitations.  ?Gastrointestinal:  Positive for diarrhea. Negative for abdominal pain, blood in stool, constipation, heartburn, melena and vomiting.  ?Genitourinary:  Negative for dysuria and urgency.   ?Musculoskeletal:  Negative for joint pain and myalgias.  ?Skin:  Negative for itching and rash.  ?Neurological:  Negative for dizziness and headaches.  ?Psychiatric/Behavioral:  Negative for depression. T

## 2022-03-17 ENCOUNTER — Encounter: Payer: Self-pay | Admitting: Internal Medicine

## 2022-12-11 ENCOUNTER — Other Ambulatory Visit: Payer: Self-pay | Admitting: Gastroenterology

## 2022-12-12 NOTE — Telephone Encounter (Signed)
Pt needs an appt

## 2022-12-22 ENCOUNTER — Encounter: Payer: Self-pay | Admitting: Internal Medicine

## 2023-01-12 ENCOUNTER — Ambulatory Visit: Payer: Medicare Other | Admitting: Gastroenterology

## 2023-12-11 ENCOUNTER — Other Ambulatory Visit: Payer: Self-pay | Admitting: Physician Assistant

## 2023-12-11 DIAGNOSIS — M7541 Impingement syndrome of right shoulder: Secondary | ICD-10-CM

## 2024-02-09 ENCOUNTER — Ambulatory Visit
Admission: RE | Admit: 2024-02-09 | Discharge: 2024-02-09 | Disposition: A | Discharge status: Home / Self Care | Source: Ambulatory Visit | Attending: Physician Assistant | Admitting: Physician Assistant

## 2024-02-09 DIAGNOSIS — M7541 Impingement syndrome of right shoulder: Secondary | ICD-10-CM

## 2024-02-09 MED ORDER — IOPAMIDOL (ISOVUE-M 200) INJECTION 41%
10.0000 mL | Freq: Once | INTRAMUSCULAR | Status: AC
  Administered 2024-02-09: 10 mL via INTRA_ARTICULAR

## 2024-05-18 NOTE — H&P (Signed)
 Patient's anticipated LOS is less than 2 midnights, meeting these requirements: - Younger than 68 - Lives within 1 hour of care - Has a competent adult at home to recover with post-op recover - NO history of  - Chronic pain requiring opiods  - Diabetes  - Coronary Artery Disease  - Heart failure  - Heart attack  - Stroke  - DVT/VTE  - Cardiac arrhythmia  - Respiratory Failure/COPD  - Renal failure  - Anemia  - Advanced Liver disease     Heidi Thompson is an 78 y.o. female.    Chief Complaint: right shoulder pain  HPI: Pt is a 78 y.o. female complaining of right shoulder pain for multiple years. Pain had continually increased since the beginning. X-rays in the clinic show end-stage arthritic changes of the right shoulder. Pt has tried various conservative treatments which have failed to alleviate their symptoms, including injections and therapy. Various options are discussed with the patient. Risks, benefits and expectations were discussed with the patient. Patient understand the risks, benefits and expectations and wishes to proceed with surgery.   PCP:  [name], DO  D/C Plans: Home  PMH: Past Medical History:  Diagnosis Date   Acid reflux    Anxiety    Arthritis    spondylotic radiculopathy, neck, back, shoulder    Cancer (H[facility]) 1978   melanoma- on R leg, removed   Diabetes mellitus without complication ([facility])    H/O total thyroidectomy 1982   Heart murmur    History of kidney stones    on imaging, saw 2 stones in kidney & pt. reports that she thinks she has passed one on her own at one time.    Hyperlipidemia    Hypertension    PONV (postoperative nausea and vomiting)     PSH: Past Surgical History:  Procedure Laterality Date   ABDOMINAL HYSTERECTOMY     ANTERIOR CERVICAL DECOMP/DISCECTOMY FUSION N/A 03/18/2017   Procedure: ACDF C4-7;  Surgeon: Burnetta Aures, MD;  [facility];  Service: Orthopedics;  Laterality: N/A;  Requests 4 hours   ANTERIOR LAT  LUMBAR FUSION N/A 07/14/2018   Procedure: Anterior lateral lumbar fusion L2-4;  Surgeon: Burnetta Aures, MD;  [facility];  Service: Orthopedics;  Laterality: N/A;  7 hrs   APPENDECTOMY     BACK SURGERY     BIOPSY  03/04/2021   Procedure: BIOPSY;  Surgeon: Cindie Carlin POUR, DO;  [facility];  Service: Endoscopy;;  Duodenal and gastric antrum and body, random colon biopsies    CHOLECYSTECTOMY     COLONOSCOPY WITH PROPOFOL  N/A 03/04/2021   Procedure: COLONOSCOPY WITH PROPOFOL ;  Surgeon: Cindie Carlin POUR, DO;  [facility];  Service: Endoscopy;  Laterality: N/A;  AM (diabetic)   ESOPHAGOGASTRODUODENOSCOPY (EGD) WITH PROPOFOL  N/A 03/04/2021   Procedure: ESOPHAGOGASTRODUODENOSCOPY (EGD) WITH PROPOFOL ;  Surgeon: Cindie Carlin POUR, DO;  [facility];  Service: Endoscopy;  Laterality: N/A;   EYE SURGERY Bilateral    cataracts /w IOL   JOINT REPLACEMENT Bilateral    knees   POLYPECTOMY  03/04/2021   Procedure: POLYPECTOMY;  Surgeon: Cindie Carlin POUR, DO;  [facility];  Service: Endoscopy;;  cecal polyp, descending colon polyp   SHOULDER ARTHROSCOPY Right 2016   SPINAL CORD STIMULATOR INSERTION N/A 05/02/2020   Procedure: SPINAL CORD STIMULATOR INSERTION;  Surgeon: Burnetta Aures, MD;  [facility];  Service: Orthopedics;  Laterality: N/A;  2.5 hrs   thumb joi  thumb Joint  Right    thumb joint On R hand replaced   TONSILLECTOMY     TOTAL THYROIDECTOMY      Social History:  reports that she has never smoked. She has never used smokeless tobacco. She reports that she does not drink alcohol and does not use drugs. BMI: Estimated body mass index is 30.04 kg/m as calculated from the following:   Height as of 01/15/22: 5' 3 (1.6 m).   Weight as of 01/15/22: 76.9 kg.  No results found for: ALBUMIN Diabetes: Patient does not have a diagnosis of diabetes.     Smoking Status:   reports that she has never smoked. She has never used smokeless  tobacco.    Allergies:  Allergies  Allergen Reactions   Azithromycin     Stomach Pain    Medications: No current facility-administered medications for this encounter.   Current Outpatient Medications  Medication Sig Dispense Refill   atorvastatin  (LIPITOR) 40 MG tablet Take 40 mg by mouth at bedtime.     carvedilol  (COREG ) 25 MG tablet Take 12.5 mg by mouth 2 (two) times daily.      chlorthalidone  (HYGROTON ) 25 MG tablet Take 25 mg by mouth daily.     Cholecalciferol (VITAMIN D3) 2000 units TABS Take 2,000 Units by mouth daily.     colestipol  (COLESTID ) 1 g tablet Take 2 tablets (2 g total) by mouth 2 (two) times daily. (Patient not taking: Reported on 01/15/2022) 360 tablet 3   escitalopram  (LEXAPRO ) 20 MG tablet Take 20 mg by mouth daily before breakfast.      levothyroxine  (SYNTHROID , LEVOTHROID) 50 MCG tablet Take 50 mcg by mouth daily before breakfast. 2 hours after breakfast     lipase/protease/amylase (CREON ) 36000 UNITS CPEP capsule Take two capsules with first bite of breakfast, lunch, and supper. Take 1 capsule with first bite of snacks. Up to 8 capsules per day. (Patient not taking: Reported on 01/15/2022) 240 capsule 5   LORazepam  (ATIVAN ) 1 MG tablet Take 1 mg by mouth 2 (two) times daily.     metFORMIN  (GLUCOPHAGE ) 500 MG tablet Take 500 mg by mouth daily with supper. (Patient not taking: Reported on 01/15/2022)     pantoprazole  (PROTONIX ) 40 MG tablet TAKE ONE TABLET BY MOUTH EVERY DAY 180 tablet 0   Polyethyl Glycol-Propyl Glycol (SYSTANE OP) Place 1 drop into both eyes daily as needed (Dry eye).     potassium chloride  SA (K-DUR,KLOR-CON ) 20 MEQ tablet Take 20 mEq by mouth daily with lunch.      sucralfate  (CARAFATE ) 1 g tablet Take 1 tablet (1 g total) by mouth 4 (four) times daily. 360 tablet 1   valsartan (DIOVAN) 80 MG tablet Take 80 mg by mouth daily.      No results found for this or any previous visit (from the past 48 hours). No results found.  ROS: Pain with  rom of the right upper extremity  Physical Exam: Alert and oriented 78 y.o. female in no acute distress Cranial nerves 2-12 intact Cervical spine: full rom with no tenderness, nv intact distally Chest: active breath sounds bilaterally, no wheeze rhonchi or rales Heart: regular rate and rhythm, no murmur Abd: non tender non distended with active bowel sounds Hip is stable with rom  Right shoulder painful and weak rom Nv intact distally No rashes or edema  Assessment/Plan Assessment: right shoulder cuff arthropathy  Plan:  Patient will undergo a right reverse total shoulder by Dr. Kay at [facility] Risks benefits  and expectations were discussed with the patient. Patient understand risks, benefits and expectations and wishes to proceed. Preoperative templating of the joint replacement has been completed, documented, and submitted to the Operating Room personnel in order to optimize intra-operative equipment management.   Arvella Fireman PA-C, MPAS [facility] is now [employer] [address], [address], [place], [place] [address] Phone: [phone] [email or web address] Facebook  [employer]

## 2024-05-29 NOTE — Progress Notes (Addendum)
 COVID Vaccine received:  []  No [x]  Yes Date of any COVID positive Test in last 90 days:  PCP - Madelin Anon NP  P) [phone] F) [phone] Cardiologist - Alm Reddish, MD at [facility] [place] [phone] Fax) [phone]   (LOV- 04-22-2024 Cardiac Clearance)  Chest x-ray - 04-27-2020  2v  Epic EKG -  2019   04-22-24 at [facility], will request  Stress Test -  ECHO -  TTE  06-07-2020 in [facility] Cardiac Cath -  CT Coronary Calcium  score:   Pacemaker / ICD device [x]  No []  Yes   Spinal Cord Stimulator:[]  No [x]  Yes  placed 05-02-20   will bring remote     History of Sleep Apnea? [x]  No []  Yes   CPAP used?- [x]  No []  Yes    Does the patient monitor blood sugar?   []  N/A   []  No []  Yes  Patient has: []  NO Hx DM   [x]  Pre-DM   []  DM1  []   DM2 Last A1c was: 5.9  on   2025   No Meds at this time  Blood Thinner / Instructions:   none Aspirin Instructions:   none  ERAS Protocol Ordered: []  No  [x]  Yes PRE-SURGERY []  ENSURE  [x]  G2   Patient is to be NPO after: 0600  Dental hx: []  Dentures:  []  N/A      []  Bridge or Partial:                   []  Loose or Damaged teeth:   Comments: The patient was given Benzoyl peroxide Gel as ordered. Instruction regarding application starting 2 days prior to surgery was given and patient voiced understanding.   Patient was given the 5 CHG shower / bath instructions for Reverse Shoulder arthroplasty surgery along with 2 bottles of the CHG soap. Patient will start this on:   Monday 06-06-24           Activity level: Able to walk up 2 flights of stairs without becoming significantly short of breath or having chest pain?  []  No   []    Yes   Anesthesia review: DOE, Murmur- ECHO 2021, HTN, GERD, CPS - failed back, Has Spinal cord stimulator, S/p ACDF C4-7 (2018), anxiety, Pre-DM- no meds currently, PONV   Patient denies any S&S of respiratory illness or Covid - no shortness of breath, fever, cough or chest pain at PAT appointment.  Patient  verbalized understanding and agreement to the Pre-Surgical Instructions that were given to them at this PAT appointment. Patient was also educated of the need to review these PAT instructions again prior to her surgery.I reviewed the appropriate phone numbers to call if they have any and questions or concerns.

## 2024-05-29 NOTE — Patient Instructions (Addendum)
 SURGICAL WAITING ROOM VISITATION Patients having surgery or a procedure may have no more than 2 support people in the waiting area - these visitors may rotate in the visitor waiting room.   If the patient needs to stay at the hospital during part of their recovery, the visitor guidelines for inpatient rooms apply.  PRE-OP VISITATION  Pre-op nurse will coordinate an appropriate time for 1 support person to accompany the patient in pre-op.  This support person may not rotate.  This visitor will be contacted when the time is appropriate for the visitor to come back in the pre-op area.  Please refer to the [facility] website for the visitor guidelines for Inpatients (after your surgery is over and you are in a regular room).  You are not required to quarantine at this time prior to your surgery. However, you must do this: Hand Hygiene often Do NOT share personal items Notify your provider if you are in close contact with someone who has COVID or you develop fever 100.4 or greater, new onset of sneezing, cough, sore throat, shortness of breath or body aches.  If you test positive for Covid or have been in contact with anyone that has tested positive in the last 10 days please notify you surgeon.    Your procedure is scheduled on:  Friday June 10, 2024  Report to [facility] Main Entrance: [place] entrance where the [employer] is available.   Report to admitting at: 06:30    AM  Call this number if you have any questions or problems the morning of surgery [phone]  Do not eat food after Midnight the night prior to your surgery/procedure.  After Midnight you may have the following liquids until  06:00 AM DAY OF SURGERY  Clear Liquid Diet Water Black Coffee (sugar ok, NO MILK/CREAM OR CREAMERS)  Tea (sugar ok, NO MILK/CREAM OR CREAMERS) regular and decaf                             Plain Jell-O  with no fruit (NO RED)                                           Fruit ices  (not with fruit pulp, NO RED)                                     Popsicles (NO RED)                                                                  Juice: NO CITRUS JUICES: only apple, WHITE grape, WHITE cranberry Sports drinks like Gatorade or Powerade (NO RED)                   The day of surgery:  Drink ONE (1) Pre-Surgery  G2 at  06:00 AM the morning of surgery. Drink in one sitting. Do not sip.  This drink was given to you during your hospital pre-op appointment visit. Nothing else to drink after completing the  Pre-Surgery G2 : No candy, chewing gum or throat lozenges.    FOLLOW ANY ADDITIONAL PRE OP INSTRUCTIONS YOU RECEIVED FROM YOUR SURGEON'S OFFICE!!!   Oral Hygiene is also important to reduce your risk of infection.        Remember - BRUSH YOUR TEETH THE MORNING OF SURGERY WITH YOUR REGULAR TOOTHPASTE  Do NOT smoke after Midnight the night before surgery.  STOP TAKING all Vitamins, Herbs and supplements 1 week before your surgery.   Take ONLY these medicines the morning of surgery with A SIP OF WATER: Pantoprazole , levothyroxine , carvedilol , escitalopram , lorazepam . You may use your Eye drops if needed.   DO NOT TAKE Valsartan or Chlorthalidone  (Hygroton ) the morning of your surgery.   BRING the remote controller for you spinal cord stimulator.                 You may not have any metal on your body including hair pins, jewelry, and body piercing  Do not wear make-up, lotions, powders, perfumes or deodorant  Do not wear nail polish including gel and S&S, artificial / acrylic nails, or any other type of covering on natural nails including finger and toenails. If you have artificial nails, gel coating, etc., that needs to be removed by a nail salon, Please have this removed prior to surgery. Not doing so may mean that your surgery could be cancelled or delayed if the Surgeon or anesthesia staff feels like they are unable to monitor you safely.   Do not shave 48 hours  prior to surgery to avoid nicks in your skin which may contribute to postoperative infections.    Contacts, Hearing Aids, dentures or bridgework may not be worn into surgery. DENTURES WILL BE REMOVED PRIOR TO SURGERY PLEASE DO NOT APPLY Poly grip OR ADHESIVES!!!  You may bring a small overnight bag with you on the day of surgery, only pack items that are not valuable. [facility] IS NOT RESPONSIBLE   FOR VALUABLES THAT ARE LOST OR STOLEN.   Patients discharged on the day of surgery will not be allowed to drive home.  Someone NEEDS to stay with you for the first 24 hours after anesthesia.  Do not bring your home medications to the hospital. The Pharmacy will dispense medications listed on your medication list to you during your admission in the Hospital.  Special Instructions: Bring a copy of your healthcare power of attorney and living will documents the day of surgery, if you wish to have them scanned into your [facility]- EPIC  Please read over the following fact sheets you were given: IF YOU HAVE QUESTIONS ABOUT YOUR PRE-OP INSTRUCTIONS, PLEASE CALL [phone].     Pre-operative 5 CHG Bath Instructions   You can play a key role in reducing the risk of infection after surgery. Your skin needs to be as free of germs as possible. You can reduce the number of germs on your skin by washing with CHG (chlorhexidine  gluconate) soap before surgery. CHG is an antiseptic soap that kills germs and continues to kill germs even after washing.   DO NOT use if you have an allergy to chlorhexidine /CHG or antibacterial soaps. If your skin becomes reddened or irritated, stop using the CHG and notify one of our RNs at [phone]  Please shower with the CHG soap starting 4 days before surgery using the following schedule: START SHOWERS ON   MONDAY  June 06, 2024  Please keep in mind the following:  DO NOT shave, including legs and underarms, starting the day of your first shower.   You may shave your face at any point before/day of surgery.   Place clean sheets on your bed the day you start using CHG soap. Use a clean washcloth (not used since being washed) for each shower. DO NOT sleep with pets once you start using the CHG.   CHG Shower Instructions:  If you choose to wash your hair and private area, wash first with your normal shampoo/soap.  After you use shampoo/soap, rinse your hair and body thoroughly to remove shampoo/soap residue.  Turn the water OFF and apply about 3 tablespoons (45 ml) of CHG soap to a CLEAN washcloth.  Apply CHG soap ONLY FROM YOUR NECK DOWN TO YOUR TOES (washing for 3-5 minutes)  DO NOT use CHG soap on face, private areas, open wounds, or sores.  Pay special attention to the area where your surgery is being performed.  If you are having back surgery, having someone wash your back for you may be helpful.  Wait 2 minutes after CHG soap is applied, then you may rinse off the CHG soap.  Pat dry with a clean towel  Put on clean clothes/pajamas   If you choose to wear lotion, please use ONLY the CHG-compatible lotions on the back of this paper.     Additional instructions for the day of surgery: DO NOT APPLY any lotions, deodorants, cologne, or perfumes.   Put on clean/comfortable clothes.  Brush your teeth.  Ask your nurse before applying any prescription medications to the skin.      CHG Compatible Lotions   Aveeno Moisturizing lotion  Cetaphil Moisturizing Cream  Cetaphil Moisturizing Lotion  Clairol Herbal Essence Moisturizing Lotion, Dry Skin  Clairol Herbal Essence Moisturizing Lotion, Extra Dry Skin  Clairol Herbal Essence Moisturizing Lotion, Normal Skin  Curel Age Defying Therapeutic Moisturizing Lotion with Alpha Hydroxy  Curel Extreme Care Body Lotion   Curel Soothing Hands Moisturizing Hand Lotion  Curel Therapeutic Moisturizing Cream, Fragrance-Free  Curel Therapeutic Moisturizing Lotion, Fragrance-Free  Curel Therapeutic Moisturizing Lotion, Original Formula  Eucerin Daily Replenishing Lotion  Eucerin Dry Skin Therapy Plus Alpha Hydroxy Crme  Eucerin Dry Skin Therapy Plus Alpha Hydroxy Lotion  Eucerin Original Crme  Eucerin Original Lotion  Eucerin Plus Crme Eucerin Plus Lotion  Eucerin TriLipid Replenishing Lotion  Keri Anti-Bacterial Hand Lotion  Keri Deep Conditioning Original Lotion Dry Skin Formula Softly Scented  Keri Deep Conditioning Original Lotion, Fragrance Free Sensitive Skin Formula  Keri Lotion Fast Absorbing Fragrance Free Sensitive Skin Formula  Keri Lotion Fast Absorbing Softly Scented Dry Skin Formula  Keri Original Lotion  Keri Skin Renewal Lotion Keri Silky Smooth Lotion  Keri Silky Smooth Sensitive Skin Lotion  Nivea Body Creamy Conditioning Oil  Nivea Body Extra Enriched Lotion  Nivea Body Original Lotion  Nivea Body Sheer Moisturizing Lotion Nivea Crme  Nivea Skin Firming Lotion  NutraDerm 30 Skin Lotion  NutraDerm Skin Lotion  NutraDerm Therapeutic Skin Cream  NutraDerm Therapeutic Skin Lotion  ProShield Protective Hand Cream  Provon moisturizing lotion      Preparing for Total Shoulder Arthroplasty ================================================================= Please follow these instructions carefully, in addition to any other special Bathing information that was explained to you at the Presurgical Appointment:  BENZOYL PEROXIDE 5% GEL: Used to kill bacteria on the skin which could cause an infection at the surgery site.   Please do not use if you have an  allergy to benzoyl peroxide. If your skin becomes reddened/irritated stop using the benzoyl peroxide and inform your Doctor.   Starting two days before surgery, apply as follows:  1. Apply benzoyl peroxide gel in the morning and at  night. Apply after taking a shower. If you are not taking a shower, clean entire shoulder front, back, and side, along with the armpit with a clean wet washcloth.  2. Place a quarter-sized dollop of the gel on your SHOULDER and rub in thoroughly, making sure to cover the front, back, and side of your shoulder, along with the armpit.   2 Days prior to Surgery  [facility] June 08, 2024 First Application _______ Morning Second Application _______ Night  Day Before Surgery    THURSDAY  June 09, 2024 First Application______ Morning  On the night before surgery, wash your entire body (except hair, face and private areas) with CHG Soap. THEN, rub in the LAST application of the Benzoyl Peroxide Gel on your shoulder.   3. On the Morning of Surgery wash your BODY AGAIN with CHG Soap (except hair, face and private areas)  4. DO NOT USE THE BENZOYL PEROXIDE GEL ON THE DAY OF YOUR SURGERY       FAILURE TO FOLLOW THESE INSTRUCTIONS MAY RESULT IN THE CANCELLATION OF YOUR SURGERY  PATIENT SIGNATURE_________________________________  NURSE SIGNATURE__________________________________  ________________________________________________________________________         Heidi Thompson    An incentive spirometer is a tool that can help keep your lungs clear and active. This tool measures how well you are filling your lungs with each breath. Taking long deep breaths may help reverse or decrease the chance of developing breathing (pulmonary) problems (especially infection) following: A long period of time when you are unable to move or be active. BEFORE THE PROCEDURE  If the spirometer includes an indicator to show your best effort, your nurse or respiratory therapist will set it to a desired goal. If possible, sit up straight or lean slightly forward. Try not to slouch. Hold the incentive spirometer in an upright position. INSTRUCTIONS FOR USE  Sit on the edge of your bed if possible, or  sit up as far as you can in bed or on a chair. Hold the incentive spirometer in an upright position. Breathe out normally. Place the mouthpiece in your mouth and seal your lips tightly around it. Breathe in slowly and as deeply as possible, raising the piston or the ball toward the top of the column. Hold your breath for 3-5 seconds or for as long as possible. Allow the piston or ball to fall to the bottom of the column. Remove the mouthpiece from your mouth and breathe out normally. Rest for a few seconds and repeat Steps 1 through 7 at least 10 times every 1-2 hours when you are awake. Take your time and take a few normal breaths between deep breaths. The spirometer may include an indicator to show your best effort. Use the indicator as a goal to work toward during each repetition. After each set of 10 deep breaths, practice coughing to be sure your lungs are clear. If you have an incision (the cut made at the time of surgery), support your incision when coughing by placing a pillow or rolled up towels firmly against it. Once you are able to get out of bed, walk around indoors and cough well. You may stop using the incentive spirometer when instructed by your caregiver.  RISKS AND COMPLICATIONS Take your time so you  do not get dizzy or light-headed. If you are in pain, you may need to take or ask for pain medication before doing incentive spirometry. It is harder to take a deep breath if you are having pain. AFTER USE Rest and breathe slowly and easily. It can be helpful to keep track of a log of your progress. Your caregiver can provide you with a simple table to help with this. If you are using the spirometer at home, follow these instructions: SEEK MEDICAL CARE IF:  You are having difficultly using the spirometer. You have trouble using the spirometer as often as instructed. Your pain medication is not giving enough relief while using the spirometer. You develop fever of 100.5 F (38.1 C)  or higher.                                                                                                    SEEK IMMEDIATE MEDICAL CARE IF:  You cough up bloody sputum that had not been present before. You develop fever of 102 F (38.9 C) or greater. You develop worsening pain at or near the incision site. MAKE SURE YOU:  Understand these instructions. Will watch your condition. Will get help right away if you are not doing well or get worse. Document Released: 03/02/2007 Document Revised: 01/12/2012 Document Reviewed: 05/03/2007 [facility] Patient Information 2014 [place], [place].        If you would like to see a video about joint replacement:   [email or web address]

## 2024-05-30 ENCOUNTER — Other Ambulatory Visit: Payer: Self-pay

## 2024-05-30 ENCOUNTER — Encounter (HOSPITAL_COMMUNITY): Payer: Self-pay

## 2024-05-30 ENCOUNTER — Encounter (HOSPITAL_COMMUNITY)
Admission: RE | Admit: 2024-05-30 | Discharge: 2024-05-30 | Disposition: A | Discharge status: Home / Self Care | Source: Ambulatory Visit | Attending: Orthopedic Surgery | Admitting: Orthopedic Surgery

## 2024-05-30 VITALS — BP 138/47 | HR 59 | Temp 98.4°F | Resp 16 | Ht 63.0 in | Wt 169.0 lb

## 2024-05-30 DIAGNOSIS — Z01818 Encounter for other preprocedural examination: Secondary | ICD-10-CM | POA: Diagnosis present

## 2024-05-30 DIAGNOSIS — G894 Chronic pain syndrome: Secondary | ICD-10-CM

## 2024-05-30 DIAGNOSIS — I2510 Atherosclerotic heart disease of native coronary artery without angina pectoris: Secondary | ICD-10-CM

## 2024-05-30 DIAGNOSIS — M19011 Primary osteoarthritis, right shoulder: Secondary | ICD-10-CM | POA: Diagnosis not present

## 2024-05-30 DIAGNOSIS — R7303 Prediabetes: Secondary | ICD-10-CM

## 2024-05-30 HISTORY — DX: Prediabetes: R73.03

## 2024-05-30 HISTORY — DX: Hypothyroidism, unspecified: E03.9

## 2024-05-30 HISTORY — DX: Depression, unspecified: F32.A

## 2024-05-30 HISTORY — DX: Myoneural disorder, unspecified: G70.9

## 2024-05-30 LAB — CBC
HCT: 37.2 % (ref 36.0–46.0)
Hemoglobin: 12.5 g/dL (ref 12.0–15.0)
MCH: 30.6 pg (ref 26.0–34.0)
MCHC: 33.6 g/dL (ref 30.0–36.0)
MCV: 91.2 fL (ref 80.0–100.0)
Platelets: 271 K/uL (ref 150–400)
RBC: 4.08 MIL/uL (ref 3.87–5.11)
RDW: 13.1 % (ref 11.5–15.5)
WBC: 9.3 K/uL (ref 4.0–10.5)
nRBC: 0 % (ref 0.0–0.2)

## 2024-05-30 LAB — COMPREHENSIVE METABOLIC PANEL WITH GFR
ALT: 19 U/L (ref 0–44)
AST: 23 U/L (ref 15–41)
Albumin: 3.9 g/dL (ref 3.5–5.0)
Alkaline Phosphatase: 49 U/L (ref 38–126)
Anion gap: 11 (ref 5–15)
BUN: 19 mg/dL (ref 8–23)
CO2: 26 mmol/L (ref 22–32)
Calcium: 9.2 mg/dL (ref 8.9–10.3)
Chloride: 103 mmol/L (ref 98–111)
Creatinine, Ser: 1.29 mg/dL — ABNORMAL HIGH (ref 0.44–1.00)
GFR, Estimated: 42 mL/min — ABNORMAL LOW (ref >60)
Glucose, Bld: 112 mg/dL — ABNORMAL HIGH (ref 70–99)
Potassium: 3.7 mmol/L (ref 3.5–5.1)
Sodium: 140 mmol/L (ref 135–145)
Total Bilirubin: 0.6 mg/dL (ref 0.0–1.2)
Total Protein: 6.4 g/dL — ABNORMAL LOW (ref 6.5–8.1)

## 2024-05-30 LAB — SURGICAL PCR SCREEN
MRSA, PCR: NEGATIVE
Staphylococcus aureus: NEGATIVE

## 2024-05-31 NOTE — Progress Notes (Signed)
 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 never smoker with h/o PONV, HTN, hypothyroidism, right shoulder OA scheduled for above procedure 06/10/2024 with Dr. Kemps Heidi.   Pt with spinal cord stimulator in place.   H/o ACDF C4-7 2018.   H/o lumbar posterior fusion L2-5.   Per cardiology notes pt with chronic mild bilateral lower extremity edema, well controlled. Echo 2021 EF over 60%, mild LVH, grade 2 LV filling, mild LA dilatation, RVSP normal, normal RV function, normal RAP, normal valves, trace MR and TR only.   Clearance received from cardiology 04/10/2024 which states, This is to inform you that the above mentioned patient was evaluated by me and has been cleared for upcoming surgery right reverse total shoulder arthroplasty. She is not currently on any anticoagulants.  Clearance received from PCP.  VS: BP (!) 138/47 Comment: left arm sitting  Pulse (!) 59   Temp 36.9 C (Oral)   Resp 16   Ht 5' 3 (1.6 m)   Wt 76.7 kg   SpO2 96%   BMI 29.94 kg/m   PROVIDERS: Heidi Charolett PEDLAR, DO is PCP   Cardiologist - Heidi Reddish, MD  LABS: Labs reviewed: Acceptable for surgery. (all labs ordered are listed, but only abnormal results are displayed)  Labs Reviewed  COMPREHENSIVE METABOLIC PANEL WITH GFR - Abnormal; Notable for the following components:      Result Value   Glucose, Bld 112 (*)    Creatinine, Ser 1.29 (*)    Total Protein 6.4 (*)    GFR, Estimated 42 (*)    All other components within normal limits  SURGICAL PCR SCREEN  CBC     IMAGES:   EKG:   CV:  Past Medical History:  Diagnosis Date   Acid reflux    Anxiety    Arthritis     spondylotic radiculopathy, neck, back, shoulder    Cancer (HCC) 1978   melanoma- on R leg, removed, basal cells and squamous cell lesions   Depression    Diabetes mellitus without complication (HCC)    H/O total thyroidectomy 1982   Heart murmur    History of kidney stones    on imaging, saw 2 stones in kidney & pt. reports that she thinks she has passed one on her own at one time.    Hyperlipidemia    Hypertension    Hypothyroidism    Neuromuscular disorder (HCC)    neuropathy, has stimulator now   PONV (postoperative nausea and vomiting)    Pre-diabetes     Past Surgical History:  Procedure Laterality Date   ABDOMINAL HYSTERECTOMY     ANTERIOR CERVICAL DECOMP/DISCECTOMY FUSION N/A 03/18/2017   Procedure: ACDF C4-7;  Surgeon: Burnetta Aures, MD;  Location: [facility] OR;  Service: Orthopedics;  Laterality: N/A;  Requests 4 hours   ANTERIOR LAT LUMBAR FUSION N/A 07/14/2018   Procedure: Anterior lateral lumbar fusion L2-4;  Surgeon: Burnetta Aures, MD;  Location: [facility] OR;  Service: Orthopedics;  Laterality: N/A;  7 hrs   APPENDECTOMY     BACK SURGERY     BIOPSY  03/04/2021   Procedure: BIOPSY;  Surgeon: Cindie Carlin POUR, DO;  Location[facility]  SU[facility];  Service: Endoscopy;;  Duodenal and gastric antrum and body, random colon biopsies    CHOLECYSTECTOMY     open cholecystectomy   COLONOSCOPY WITH PROPOFOL  N/A 03/04/2021   Procedure: COLONOSCOPY WITH PROPOFOL ;  Surgeon: Cindie Carlin POUR, DO;  [facility];  Service: Endoscopy;  Laterality: N/A;  AM (diabetic)   ESOPHAGOGASTRODUODENOSCOPY (EGD) WITH PROPOFOL  N/A 03/04/2021   Procedure: ESOPHAGOGASTRODUODENOSCOPY (EGD) WITH PROPOFOL ;  Surgeon: Cindie Carlin POUR, DO;  [facility];  Service: Endoscopy;  Laterality: N/A;   EYE SURGERY Bilateral    cataracts /w IOL   JOINT REPLACEMENT Bilateral    knees   POLYPECTOMY  03/04/2021   Procedure: POLYPECTOMY;  Surgeon: Cindie Carlin POUR, DO;  [facility];  Service:  Endoscopy;;  cecal polyp, descending colon polyp   SHOULDER ARTHROSCOPY Right 2016   SPINAL CORD STIMULATOR INSERTION N/A 05/02/2020   Procedure: SPINAL CORD STIMULATOR INSERTION;  Surgeon: Burnetta Aures, MD;  Location: [facility] OR;  Service: Orthopedics;  Laterality: N/A;  2.5 hrs   thumb Joint  Right    thumb joint On R hand replaced   TONSILLECTOMY     TOTAL THYROIDECTOMY      MEDICATIONS:  ascorbic acid (VITAMIN C) 500 MG tablet   atorvastatin  (LIPITOR) 40 MG tablet   carvedilol  (COREG ) 25 MG tablet   chlorthalidone  (HYGROTON ) 25 MG tablet   Cholecalciferol (VITAMIN D3) 2000 units TABS   colestipol  (COLESTID ) 1 g tablet   escitalopram  (LEXAPRO ) 20 MG tablet   levothyroxine  (SYNTHROID , LEVOTHROID) 50 MCG tablet   lipase/protease/amylase (CREON ) 36000 UNITS CPEP capsule   LORazepam  (ATIVAN ) 1 MG tablet   metFORMIN  (GLUCOPHAGE ) 500 MG tablet   pantoprazole  (PROTONIX ) 40 MG tablet   Polyethyl Glycol-Propyl Glycol (SYSTANE OP)   potassium chloride  SA (K-DUR,KLOR-CON ) 20 MEQ tablet   sucralfate  (CARAFATE ) 1 g tablet   valsartan (DIOVAN) 80 MG tablet   No current facility-administered medications for this encounter.    Harlene Hoots Ward, PA-C [facility] Pre-Surgical Testing [phone]

## 2024-05-31 NOTE — Anesthesia Preprocedure Evaluation (Addendum)
 Anesthesia Evaluation  Patient identified by MRN, date of birth, ID band Patient awake    Reviewed: Allergy & Precautions, NPO status , Patient's Chart, lab work & pertinent test results  Airway Mallampati: III  TM Distance: >3 FB Neck ROM: Full    Dental no notable dental hx.    Pulmonary neg pulmonary ROS   Pulmonary exam normal        Cardiovascular hypertension, Pt. on medications and Pt. on home beta blockers Normal cardiovascular exam     Neuro/Psych  PSYCHIATRIC DISORDERS Anxiety Depression    negative neurological ROS     GI/Hepatic Neg liver ROS,GERD  Medicated and Controlled,,  Endo/Other  diabetes, Oral Hypoglycemic AgentsHypothyroidism    Renal/GU Renal InsufficiencyRenal disease     Musculoskeletal  (+) Arthritis ,    Abdominal   Peds  Hematology negative hematology ROS (+)   Anesthesia Other Findings Arthritis of right shoulder  Reproductive/Obstetrics                              Anesthesia Physical Anesthesia Plan  ASA: 3  Anesthesia Plan: General and Regional   Post-op Pain Management: Regional block*   Induction: Intravenous  PONV Risk Score and Plan: 3 and Ondansetron , Dexamethasone  and Treatment may vary due to age or medical condition  Airway Management Planned: Oral ETT  Additional Equipment:   Intra-op Plan:   Post-operative Plan: Extubation in OR  Informed Consent: I have reviewed the patients History and Physical, chart, labs and discussed the procedure including the risks, benefits and alternatives for the proposed anesthesia with the patient or authorized representative who has indicated his/her understanding and acceptance.     Dental advisory given  Plan Discussed with: CRNA  Anesthesia Plan Comments: (PAT note 05/30/2024)         Anesthesia Quick Evaluation

## 2024-06-10 ENCOUNTER — Ambulatory Visit (HOSPITAL_COMMUNITY)

## 2024-06-10 ENCOUNTER — Encounter (HOSPITAL_COMMUNITY)
Admission: RE | Disposition: A | Discharge status: Home / Self Care | Payer: Self-pay | Source: Home / Self Care | Attending: Orthopedic Surgery

## 2024-06-10 ENCOUNTER — Ambulatory Visit (HOSPITAL_COMMUNITY): Admitting: Anesthesiology

## 2024-06-10 ENCOUNTER — Encounter (HOSPITAL_COMMUNITY): Payer: Self-pay | Admitting: Orthopedic Surgery

## 2024-06-10 ENCOUNTER — Ambulatory Visit (HOSPITAL_COMMUNITY): Payer: Self-pay | Admitting: Physician Assistant

## 2024-06-10 ENCOUNTER — Ambulatory Visit (HOSPITAL_COMMUNITY)
Admission: RE | Admit: 2024-06-10 | Discharge: 2024-06-10 | Disposition: A | Discharge status: Home / Self Care | Attending: Orthopedic Surgery | Admitting: Orthopedic Surgery

## 2024-06-10 ENCOUNTER — Other Ambulatory Visit: Payer: Self-pay

## 2024-06-10 DIAGNOSIS — M19011 Primary osteoarthritis, right shoulder: Secondary | ICD-10-CM | POA: Diagnosis present

## 2024-06-10 DIAGNOSIS — Z79899 Other long term (current) drug therapy: Secondary | ICD-10-CM | POA: Insufficient documentation

## 2024-06-10 DIAGNOSIS — I10 Essential (primary) hypertension: Secondary | ICD-10-CM | POA: Insufficient documentation

## 2024-06-10 DIAGNOSIS — E119 Type 2 diabetes mellitus without complications: Secondary | ICD-10-CM | POA: Insufficient documentation

## 2024-06-10 DIAGNOSIS — F418 Other specified anxiety disorders: Secondary | ICD-10-CM

## 2024-06-10 DIAGNOSIS — F32A Depression, unspecified: Secondary | ICD-10-CM | POA: Insufficient documentation

## 2024-06-10 DIAGNOSIS — E039 Hypothyroidism, unspecified: Secondary | ICD-10-CM | POA: Diagnosis not present

## 2024-06-10 DIAGNOSIS — F419 Anxiety disorder, unspecified: Secondary | ICD-10-CM | POA: Diagnosis not present

## 2024-06-10 DIAGNOSIS — Z7984 Long term (current) use of oral hypoglycemic drugs: Secondary | ICD-10-CM | POA: Diagnosis not present

## 2024-06-10 HISTORY — PX: REVERSE SHOULDER ARTHROPLASTY: SHX5054

## 2024-06-10 LAB — GLUCOSE, CAPILLARY: Glucose-Capillary: 116 mg/dL — ABNORMAL HIGH (ref 70–99)

## 2024-06-10 SURGERY — ARTHROPLASTY, SHOULDER, TOTAL, REVERSE
Anesthesia: Regional | Site: Shoulder | Laterality: Right

## 2024-06-10 MED ORDER — BUPIVACAINE HCL (PF) 0.5 % IJ SOLN
INTRAMUSCULAR | Status: DC | PRN
Start: 2024-06-10 — End: 2024-06-10
  Administered 2024-06-10: 15 mL via PERINEURAL

## 2024-06-10 MED ORDER — PROPOFOL 1000 MG/100ML IV EMUL
INTRAVENOUS | Status: AC
  Filled 2024-06-10: qty 100

## 2024-06-10 MED ORDER — LIDOCAINE HCL (PF) 2 % IJ SOLN
INTRAMUSCULAR | Status: DC | PRN
  Administered 2024-06-10: 100 mg via INTRADERMAL

## 2024-06-10 MED ORDER — 0.9 % SODIUM CHLORIDE (POUR BTL) OPTIME
TOPICAL | Status: DC | PRN
  Administered 2024-06-10: 1000 mL

## 2024-06-10 MED ORDER — ORAL CARE MOUTH RINSE: 15.0000 mL | Freq: Once | OROMUCOSAL | Status: AC

## 2024-06-10 MED ORDER — BUPIVACAINE-EPINEPHRINE (PF) 0.25% -1:200000 IJ SOLN
INTRAMUSCULAR | Status: AC
  Filled 2024-06-10: qty 30

## 2024-06-10 MED ORDER — FENTANYL CITRATE (PF) 100 MCG/2ML IJ SOLN
INTRAMUSCULAR | Status: DC | PRN
  Administered 2024-06-10 (×2): 50 ug via INTRAVENOUS

## 2024-06-10 MED ORDER — ATROPINE SULFATE 0.4 MG/ML IV SOLN
INTRAVENOUS | Status: DC | PRN
  Administered 2024-06-10: .2 mg via INTRAVENOUS

## 2024-06-10 MED ORDER — CHLORHEXIDINE GLUCONATE 0.12 % MT SOLN
15.0000 mL | Freq: Once | OROMUCOSAL | Status: AC
  Administered 2024-06-10: 15 mL via OROMUCOSAL

## 2024-06-10 MED ORDER — DEXAMETHASONE SODIUM PHOSPHATE 10 MG/ML IJ SOLN
INTRAMUSCULAR | Status: AC
  Filled 2024-06-10: qty 1

## 2024-06-10 MED ORDER — LACTATED RINGERS IV SOLN: INTRAVENOUS | Status: DC

## 2024-06-10 MED ORDER — VANCOMYCIN HCL 1000 MG IV SOLR
INTRAVENOUS | Status: AC
Start: 2024-06-10 — End: 2024-06-10
  Filled 2024-06-10: qty 20

## 2024-06-10 MED ORDER — ONDANSETRON HCL 4 MG/2ML IJ SOLN: 4.0000 mg | Freq: Once | INTRAMUSCULAR | Status: DC | PRN

## 2024-06-10 MED ORDER — SUCCINYLCHOLINE CHLORIDE 200 MG/10ML IV SOSY
PREFILLED_SYRINGE | INTRAVENOUS | Status: DC | PRN
  Administered 2024-06-10: 80 mg via INTRAVENOUS

## 2024-06-10 MED ORDER — ONDANSETRON HCL 4 MG/2ML IJ SOLN
INTRAMUSCULAR | Status: AC
  Filled 2024-06-10: qty 2

## 2024-06-10 MED ORDER — TRANEXAMIC ACID-NACL 1000-0.7 MG/100ML-% IV SOLN
1000.0000 mg | INTRAVENOUS | Status: AC
  Administered 2024-06-10: 1000 mg via INTRAVENOUS
  Filled 2024-06-10: qty 100

## 2024-06-10 MED ORDER — ONDANSETRON HCL 4 MG/2ML IJ SOLN
INTRAMUSCULAR | Status: DC | PRN
  Administered 2024-06-10: 4 mg via INTRAVENOUS

## 2024-06-10 MED ORDER — GLYCOPYRROLATE 0.2 MG/ML IJ SOLN
INTRAMUSCULAR | Status: DC | PRN
  Administered 2024-06-10: .2 mg via INTRAVENOUS

## 2024-06-10 MED ORDER — SUCCINYLCHOLINE CHLORIDE 200 MG/10ML IV SOSY
PREFILLED_SYRINGE | INTRAVENOUS | Status: AC
  Filled 2024-06-10: qty 10

## 2024-06-10 MED ORDER — BUPIVACAINE-EPINEPHRINE (PF) 0.25% -1:200000 IJ SOLN
INTRAMUSCULAR | Status: DC | PRN
  Administered 2024-06-10: 15 mL via PERINEURAL

## 2024-06-10 MED ORDER — PROPOFOL 10 MG/ML IV BOLUS
INTRAVENOUS | Status: AC
  Filled 2024-06-10: qty 20

## 2024-06-10 MED ORDER — PHENYLEPHRINE HCL-NACL 20-0.9 MG/250ML-% IV SOLN
INTRAVENOUS | Status: DC | PRN
  Administered 2024-06-10: 80 ug/min via INTRAVENOUS

## 2024-06-10 MED ORDER — TRAMADOL HCL 50 MG PO TABS: 50.0000 mg | ORAL_TABLET | Freq: Four times a day (QID) | ORAL | 0 refills | Status: AC | PRN

## 2024-06-10 MED ORDER — EPHEDRINE SULFATE-NACL 50-0.9 MG/10ML-% IV SOSY
PREFILLED_SYRINGE | INTRAVENOUS | Status: DC | PRN
  Administered 2024-06-10: 5 mg via INTRAVENOUS

## 2024-06-10 MED ORDER — SUGAMMADEX SODIUM 200 MG/2ML IV SOLN
INTRAVENOUS | Status: AC
Start: 2024-06-10 — End: 2024-06-10
  Filled 2024-06-10: qty 2

## 2024-06-10 MED ORDER — FENTANYL CITRATE (PF) 100 MCG/2ML IJ SOLN
INTRAMUSCULAR | Status: AC
Start: 2024-06-10 — End: 2024-06-10
  Filled 2024-06-10: qty 2

## 2024-06-10 MED ORDER — GLYCOPYRROLATE 0.2 MG/ML IJ SOLN
INTRAMUSCULAR | Status: AC
  Filled 2024-06-10: qty 1

## 2024-06-10 MED ORDER — EPHEDRINE 5 MG/ML INJ
INTRAVENOUS | Status: AC
  Filled 2024-06-10: qty 5

## 2024-06-10 MED ORDER — PROPOFOL 10 MG/ML IV BOLUS
INTRAVENOUS | Status: DC | PRN
  Administered 2024-06-10: 50 ug/kg/min via INTRAVENOUS
  Administered 2024-06-10 (×2): 100 mg via INTRAVENOUS

## 2024-06-10 MED ORDER — ACETAMINOPHEN 10 MG/ML IV SOLN: 1000.0000 mg | Freq: Once | INTRAVENOUS | Status: DC | PRN

## 2024-06-10 MED ORDER — AMISULPRIDE (ANTIEMETIC) 5 MG/2ML IV SOLN: 10.0000 mg | Freq: Once | INTRAVENOUS | Status: DC | PRN

## 2024-06-10 MED ORDER — CEFAZOLIN SODIUM-DEXTROSE 2-4 GM/100ML-% IV SOLN
2.0000 g | INTRAVENOUS | Status: AC
  Administered 2024-06-10: 2 g via INTRAVENOUS
  Filled 2024-06-10: qty 100

## 2024-06-10 MED ORDER — FENTANYL CITRATE PF 50 MCG/ML IJ SOSY: 25.0000 ug | PREFILLED_SYRINGE | INTRAMUSCULAR | Status: DC | PRN

## 2024-06-10 MED ORDER — BUPIVACAINE LIPOSOME 1.3 % IJ SUSP
INTRAMUSCULAR | Status: DC | PRN
  Administered 2024-06-10: 10 mL via PERINEURAL

## 2024-06-10 MED ORDER — DEXAMETHASONE SODIUM PHOSPHATE 10 MG/ML IJ SOLN
INTRAMUSCULAR | Status: DC | PRN
  Administered 2024-06-10: 4 mg via INTRAVENOUS

## 2024-06-10 MED ORDER — ROCURONIUM BROMIDE 10 MG/ML (PF) SYRINGE
PREFILLED_SYRINGE | INTRAVENOUS | Status: AC
  Filled 2024-06-10: qty 10

## 2024-06-10 MED ORDER — FENTANYL CITRATE PF 50 MCG/ML IJ SOSY
50.0000 ug | PREFILLED_SYRINGE | INTRAMUSCULAR | Status: DC
  Administered 2024-06-10: 50 ug via INTRAVENOUS
  Filled 2024-06-10: qty 2

## 2024-06-10 MED ORDER — VANCOMYCIN HCL 1 G IV SOLR
INTRAVENOUS | Status: DC | PRN
  Administered 2024-06-10: 1000 mg via TOPICAL

## 2024-06-10 SURGICAL SUPPLY — 60 items
BAG COUNTER SPONGE SURGICOUNT (BAG) IMPLANT
BAG ZIPLOCK 12X15 (MISCELLANEOUS) IMPLANT
BIT DRILL 1.6MX128 (BIT) IMPLANT
BIT DRILL 170X2.5X (BIT) IMPLANT
BLADE SAG 18X100X1.27 (BLADE) ×1 IMPLANT
COVER BACK TABLE 60X90IN (DRAPES) ×1 IMPLANT
COVER SURGICAL LIGHT HANDLE (MISCELLANEOUS) ×1 IMPLANT
DRAPE INCISE IOBAN 66X45 STRL (DRAPES) ×1 IMPLANT
DRAPE POUCH INSTRU U-SHP 10X18 (DRAPES) ×1 IMPLANT
DRAPE SHEET LG 3/4 BI-LAMINATE (DRAPES) ×1 IMPLANT
DRAPE SURG ORHT 6 SPLT 77X108 (DRAPES) ×2 IMPLANT
DRAPE TOP 10253 STERILE (DRAPES) ×1 IMPLANT
DRAPE U-SHAPE 47X51 STRL (DRAPES) ×1 IMPLANT
DRSG ADAPTIC 3X8 NADH LF (GAUZE/BANDAGES/DRESSINGS) ×1 IMPLANT
DRSG AQUACEL AG ADV 3.5X10 (GAUZE/BANDAGES/DRESSINGS) ×1 IMPLANT
DURAPREP 26ML APPLICATOR (WOUND CARE) ×1 IMPLANT
ECCENTRIC EPIPHYSI MODULAR SZ1 (Trauma) IMPLANT
ELECT BLADE TIP CTD 4 INCH (ELECTRODE) ×1 IMPLANT
ELECT NDL TIP 2.8 STRL (NEEDLE) ×1 IMPLANT
ELECT NEEDLE TIP 2.8 STRL (NEEDLE) ×1 IMPLANT
ELECT PENCIL ROCKER SW 15FT (MISCELLANEOUS) ×1 IMPLANT
ELECT REM PT RETURN 15FT ADLT (MISCELLANEOUS) ×1 IMPLANT
FACESHIELD WRAPAROUND OR TEAM (MASK) ×1 IMPLANT
GAUZE PAD ABD 8X10 STRL (GAUZE/BANDAGES/DRESSINGS) ×1 IMPLANT
GAUZE SPONGE 4X4 12PLY STRL (GAUZE/BANDAGES/DRESSINGS) ×1 IMPLANT
GLENOSPHERE DELTA XTEND LAT 38 (Miscellaneous) IMPLANT
GLOVE BIOGEL PI IND STRL 7.5 (GLOVE) ×1 IMPLANT
GLOVE BIOGEL PI IND STRL 8.5 (GLOVE) ×1 IMPLANT
GLOVE ORTHO TXT STRL SZ7.5 (GLOVE) ×1 IMPLANT
GLOVE SURG ORTHO 8.5 STRL (GLOVE) ×1 IMPLANT
GOWN STRL REUS W/ TWL XL LVL3 (GOWN DISPOSABLE) ×2 IMPLANT
KIT BASIN OR (CUSTOM PROCEDURE TRAY) ×1 IMPLANT
KIT TURNOVER KIT A (KITS) ×1 IMPLANT
MANIFOLD NEPTUNE II (INSTRUMENTS) ×1 IMPLANT
METAGLENE DELTA EXTEND (Trauma) IMPLANT
NDL MAYO CATGUT SZ4 TPR NDL (NEEDLE) IMPLANT
NEEDLE MAYO CATGUT SZ4 (NEEDLE) IMPLANT
NS IRRIG 1000ML POUR BTL (IV SOLUTION) ×1 IMPLANT
PACK SHOULDER (CUSTOM PROCEDURE TRAY) ×1 IMPLANT
PIN GUIDE 1.2 (PIN) IMPLANT
PIN GUIDE GLENOPHERE 1.5MX300M (PIN) IMPLANT
PIN METAGLENE 2.5 (PIN) IMPLANT
RESTRAINT HEAD UNIVERSAL NS (MISCELLANEOUS) ×1 IMPLANT
SCREW 4.5X36MM (Screw) IMPLANT
SCREW LOCK DELTA XTEND 4.5X30 (Screw) IMPLANT
SLING ARM FOAM STRAP LRG (SOFTGOODS) IMPLANT
SPACER 38 PLUS 3 (Spacer) IMPLANT
SPIKE FLUID TRANSFER (MISCELLANEOUS) ×1 IMPLANT
STEM HUMERAL SZ8 STD (Stem) IMPLANT
STRIP CLOSURE SKIN 1/2X4 (GAUZE/BANDAGES/DRESSINGS) ×1 IMPLANT
SUT MNCRL AB 4-0 PS2 18 (SUTURE) ×1 IMPLANT
SUT VIC AB 0 CT1 36 (SUTURE) ×1 IMPLANT
SUT VIC AB 0 CT2 27 (SUTURE) ×1 IMPLANT
SUT VIC AB 2-0 CT1 TAPERPNT 27 (SUTURE) ×1 IMPLANT
SUTURE FIBERWR #2 38 T-5 BLUE (SUTURE) ×2 IMPLANT
SUTURE FIBERWR#2 38 REV NDL BL (SUTURE) IMPLANT
TAPE CLOTH SURG 4X10 WHT LF (GAUZE/BANDAGES/DRESSINGS) IMPLANT
TOWEL GREEN STERILE FF (TOWEL DISPOSABLE) ×1 IMPLANT
TOWEL OR 17X26 10 PK STRL BLUE (TOWEL DISPOSABLE) ×1 IMPLANT
YANKAUER SUCT BULB TIP NO VENT (SUCTIONS) ×1 IMPLANT

## 2024-06-10 NOTE — Anesthesia Procedure Notes (Signed)
 Procedure Name: Intubation Date/Time: 06/10/2024 9:44 AM  Performed by: Franchot Delon RAMAN, CRNAPre-anesthesia Checklist: Patient identified, Emergency Drugs available, Suction available and Patient being monitored Patient Re-evaluated:Patient Re-evaluated prior to induction Oxygen Delivery Method: Circle System Utilized Preoxygenation: Pre-oxygenation with 100% oxygen Induction Type: IV induction Ventilation: Mask ventilation without difficulty Laryngoscope Size: Mac and 3 Grade View: Grade I Tube type: Oral Tube size: 7.0 mm Number of attempts: 1 Airway Equipment and Method: Stylet and Oral airway Placement Confirmation: ETT inserted through vocal cords under direct vision, positive ETCO2 and breath sounds checked- equal and bilateral Secured at: 22 cm Tube secured with: Tape Dental Injury: Teeth and Oropharynx as per pre-operative assessment  Comments: Intubation attempted x1 by [place], paramedic student. Unable to visualize cords. Intubation by CRNA with no trauma.

## 2024-06-10 NOTE — Anesthesia Postprocedure Evaluation (Signed)
 Anesthesia Post Note  Patient: Heidi Thompson  Procedure(s) Performed: ARTHROPLASTY, SHOULDER, TOTAL, REVERSE (Right: Shoulder)     Patient location during evaluation: PACU Anesthesia Type: Regional and General Level of consciousness: awake Pain management: pain level controlled Vital Signs Assessment: post-procedure vital signs reviewed and stable Respiratory status: spontaneous breathing, nonlabored ventilation and respiratory function stable Cardiovascular status: blood pressure returned to baseline and stable Postop Assessment: no apparent nausea or vomiting Anesthetic complications: no   No notable events documented.  Last Vitals:  Vitals:   06/10/24 1245 06/10/24 1247  BP:  (!) 153/63  Pulse: 67 63  Resp:  16  Temp:  36.5 C  SpO2: 91% 92%    Last Pain:  Vitals:   06/10/24 1247  TempSrc: Oral  PainSc: 0-No pain                 Farra Nikolic P [place]

## 2024-06-10 NOTE — Interval H&P Note (Signed)
 History and Physical Interval Note:  06/10/2024 7:30 AM  Heidi Thompson  has presented today for surgery, with the diagnosis of Arthritis of right shoulder.  The various methods of treatment have been discussed with the patient and family. After consideration of risks, benefits and other options for treatment, the patient has consented to  Procedure(s): ARTHROPLASTY, SHOULDER, TOTAL, REVERSE (Right) as a surgical intervention.  The patient's history has been reviewed, patient examined, no change in status, stable for surgery.  I have reviewed the patient's chart and labs.  Questions were answered to the patient's satisfaction.     [name]

## 2024-06-10 NOTE — Evaluation (Signed)
 Occupational Therapy Evaluation/Discharge Patient Details Name: Heidi Thompson MRN: [id number] DOB: 1946-03-07 Today's Date: 06/10/2024   History of Present Illness   Pt is an 78 y/o female presenting for R reverse total shoulder arthroplasty in setting of OA. PMH: OA, melanoma, DM, kidney stones, HLD, HTN     Clinical Impressions PTA, pt lives with spouse, typically Modified Independent with ADLs, IADLs and mobility with intermittent cane use. Pt received post sx noted above. Educated pt and family (daughter and husband) on sling mgmt, allowed movements, WB restrictions, positioning and ADL strategies to maintain shoulder precautions. Pt requiring Min A for ADLs observed today; family able to provide this assistance at home. Recommend follow up therapy pending surgeon's shoulder protocol.      If plan is discharge home, recommend the following:   A lot of help with bathing/dressing/bathroom;Assistance with cooking/housework;Assist for transportation     Functional Status Assessment   Patient has had a recent decline in their functional status and demonstrates the ability to make significant improvements in function in a reasonable and predictable amount of time.     Equipment Recommendations   None recommended by OT     Recommendations for Other Services         Precautions/Restrictions   Precautions Precautions: Shoulder Shoulder Interventions: Shoulder sling/immobilizer;Off for dressing/bathing/exercises Precaution Booklet Issued: Yes (comment) Required Braces or Orthoses: Sling Restrictions Weight Bearing Restrictions Per Provider Order: Yes RUE Weight Bearing Per Provider Order: Non weight bearing     Mobility Bed Mobility               General bed mobility comments: received leaving bathroom with NT    Transfers Overall transfer level: Independent Equipment used: None                      Balance Overall balance assessment: Mild  deficits observed, not formally tested                                         ADL either performed or assessed with clinical judgement   ADL Overall ADL's : Needs assistance/impaired Eating/Feeding: Independent   Grooming: Supervision/safety;Standing   Upper Body Bathing: Minimal assistance   Lower Body Bathing: Supervison/ safety;Sit to/from stand;Sitting/lateral leans   Upper Body Dressing : Minimal assistance;Sitting Upper Body Dressing Details (indicate cue type and reason): assist to don button up shirt Lower Body Dressing: Minimal assistance;Sit to/from stand;Sitting/lateral leans Lower Body Dressing Details (indicate cue type and reason): assist for pants over feet completed by daughter. pt able to stand and pull over waist using LUE Toilet Transfer: Contact guard Marine scientist Details (indicate cue type and reason): exiting bathroom with NT Toileting- Clothing Manipulation and Hygiene: Contact guard assist;Sit to/from stand;Sitting/lateral lean               Vision Baseline Vision/History: 1 Wears glasses Ability to See in Adequate Light: 0 Adequate Patient Visual Report: No change from baseline Vision Assessment?: No apparent visual deficits     Perception         Praxis         Pertinent Vitals/Pain Pain Assessment Pain Assessment: Faces Faces Pain Scale: No hurt Pain Intervention(s): Monitored during session     Extremity/Trunk Assessment Upper Extremity Assessment Upper Extremity Assessment: Right hand dominant;RUE deficits/detail RUE Deficits / Details: s/p shoulder replacement. AAROM elbow flexion/extension, able to move wrist  and hold items with R hand though dropping at times; nerve block still in effect   Lower Extremity Assessment Lower Extremity Assessment: Overall WFL for tasks assessed   Cervical / Trunk Assessment Cervical / Trunk Assessment: Normal   Communication Communication Communication: No  apparent difficulties   Cognition Arousal: Alert Behavior During Therapy: WFL for tasks assessed/performed Cognition: No apparent impairments                               Following commands: Intact       Cueing  General Comments   Cueing Techniques: Verbal cues  Daughter and husband at bedside   Exercises     Shoulder Instructions      Home Living Family/patient expects to be discharged to:: Private residence Living Arrangements: Spouse/significant other Available Help at Discharge: Family;Available 24 hours/day Type of Home: House Home Access: Stairs to enter [employer] of Steps: 2-3   Home Layout: One level     Bathroom Shower/Tub: Producer, television/film/video: Standard     Home Equipment: Agricultural consultant (2 wheels);Cane - single point          Prior Functioning/Environment Prior Level of Function : Independent/Modified Independent             Mobility Comments: occasional use of cane for mobility to correct balance ADLs Comments: Indep with ADLs, IADLs    OT Problem List: Impaired UE functional use   OT Treatment/Interventions:        OT Goals(Current goals can be found in the care plan section)   Acute Rehab OT Goals Patient Stated Goal: for improvements in R shoulder function/pain OT Goal Formulation: All assessment and education complete, DC therapy   OT Frequency:       Co-evaluation              AM-PAC OT 6 Clicks Daily Activity     Outcome Measure Help from another person eating meals?: None Help from another person taking care of personal grooming?: A Little Help from another person toileting, which includes using toliet, bedpan, or urinal?: A Little Help from another person bathing (including washing, rinsing, drying)?: A Little Help from another person to put on and taking off regular upper body clothing?: A Little Help from another person to put on and taking off regular lower body  clothing?: A Little 6 Click Score: 19   End of Session Equipment Utilized During Treatment: Other (comment) (sling) Nurse Communication: Mobility status;Other (comment) (family inquiring about sling recommendations)  Activity Tolerance: Patient tolerated treatment well Patient left: in chair;with call bell/phone within reach;with family/visitor present  OT Visit Diagnosis: Pain Pain - Right/Left: Right Pain - part of body: Shoulder                Time: 8760-8696 OT Time Calculation (min): 24 min Charges:  OT General Charges $OT Visit: 1 Visit OT Evaluation $OT Eval Low Complexity: 1 Low  Mliss NOVAK, OTR/L Acute Rehab Services Office: [phone]   Mliss Fish 06/10/2024, 1:15 PM

## 2024-06-10 NOTE — Brief Op Note (Signed)
 06/10/2024  11:19 AM  PATIENT:  Heidi Thompson  78 y.o. female  PRE-OPERATIVE DIAGNOSIS:  Arthritis of right shoulder, end stage  POST-OPERATIVE DIAGNOSIS:  Arthritis of right shoulder, end stage  PROCEDURE:  Procedure(s): ARTHROPLASTY, SHOULDER, TOTAL, REVERSE (Right) DePuy Delta Xtend with no subscap repair  SURGEON:  Surgeons and Role:    DEWAINE Kay Kemps, MD - Primary  PHYSICIAN ASSISTANT:   ASSISTANTS: Debby KATHEE Fireman, PA-C   ANESTHESIA:   regional and general  EBL:  100 mL   BLOOD ADMINISTERED:none  DRAINS: none   LOCAL MEDICATIONS USED:  MARCAINE      SPECIMEN:  No Specimen  DISPOSITION OF SPECIMEN:  N/A  COUNTS:  YES  TOURNIQUET:  * No tourniquets in log *  DICTATION: .Other Dictation: Dictation Number [id number]  PLAN OF CARE: Discharge to home after PACU  PATIENT DISPOSITION:  PACU - hemodynamically stable.   Delay start of Pharmacological VTE agent (>24hrs) due to surgical blood loss or risk of bleeding: not applicable

## 2024-06-10 NOTE — Discharge Instructions (Signed)
 Ice to the shoulder constantly.  Keep the surgical bandage on for two days then change to the gel(Aquacel) bandage which is waterproof, You are then ok to get it wet in the shower.Remove that bandage in one week and leave it open to air, still ok to get it wet in the shower.   Do exercise as instructed several times per day.  DO NOT reach behind your back or push up out of a chair with the operative arm.  Use a sling while you are up and around for comfort, may remove while seated.  Keep pillow propped behind the operative elbow.  Follow up with Dr Kay in two weeks in the office, call [phone] for appt  Please call Dr Kay (cell) [phone] with any questions or concerns

## 2024-06-10 NOTE — Transfer of Care (Signed)
 Immediate Anesthesia Transfer of Care Note  Patient: Heidi Thompson  Procedure(s) Performed: ARTHROPLASTY, SHOULDER, TOTAL, REVERSE (Right: Shoulder)  Patient Location: PACU  Anesthesia Type:General  Level of Consciousness: awake, alert , and oriented  Airway & Oxygen Therapy: Patient Spontanous Breathing and Patient connected to nasal cannula oxygen  Post-op Assessment: Report given to RN and Post -op Vital signs reviewed and stable  Post vital signs: Reviewed and stable  Last Vitals:  Vitals Value Taken Time  BP 165/65 06/10/24 11:18  Temp 36.4 C 06/10/24 11:18  Pulse 65 06/10/24 11:23  Resp 12 06/10/24 11:23  SpO2 96 % 06/10/24 11:23  Vitals shown include unfiled device data.  Last Pain:  Vitals:   06/10/24 0835  TempSrc:   PainSc: 0-No pain      Patients Stated Pain Goal: 7 (06/10/24 0700)  Complications: No notable events documented.

## 2024-06-10 NOTE — Anesthesia Procedure Notes (Signed)
 Anesthesia Regional Block: Interscalene brachial plexus block   Pre-Anesthetic Checklist: , timeout performed,  Correct Patient, Correct Site, Correct Laterality,  Correct Procedure, Correct Position, site marked,  Risks and benefits discussed,  Surgical consent,  Pre-op evaluation,  At surgeon's request and post-op pain management  Laterality: Right  Prep: chloraprep       Needles:  Injection technique: Single-shot  Needle Type: Echogenic Stimulator Needle     Needle Length: 9cm  Needle Gauge: 21     Additional Needles:   Procedures:,,,, ultrasound used (permanent image in chart),,    Narrative:  Start time: 06/10/2024 8:20 AM End time: 06/10/2024 8:30 AM Injection made incrementally with aspirations every 5 mL.  Performed by: Personally  Anesthesiologist: Patrisha Bernardino SQUIBB, MD  Additional Notes: Functioning IV was confirmed and monitors were applied.  A timeout was performed. Sterile prep, hand hygiene and sterile gloves were used. A 90mm 21ga Arrow echogenic stimulator needle was used. Negative aspiration and negative test dose prior to incremental administration of local anesthetic. The patient tolerated the procedure well.  Ultrasound guidance: relevent anatomy identified, needle position confirmed, local anesthetic spread visualized around nerve(s), vascular puncture avoided.  Image printed for medical record.

## 2024-06-11 NOTE — Op Note (Signed)
 [name], Thompson MEDICAL RECORD NO: [id number] ACCOUNT NO: [id number] DATE OF BIRTH: 1946-03-17 FACILITY: [facility] LOCATION: [facility] PHYSICIAN: Elspeth SAUNDERS. 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  PROCEDURE PERFORMED: Right reverse total shoulder arthroplasty using DePuy Delta Xtend prosthesis with no subscapularis repair.  ATTENDING SURGEON: Elspeth SAUNDERS. Kay, MD  ASSISTANT: Debby Crock [place], [place], who was scrubbed during the entire procedure, and necessary for satisfactory completion of surgery.  ANESTHESIA: General anesthesia was used plus interscalene block.  ESTIMATED BLOOD LOSS: 100 mL.  FLUID REPLACEMENT: 1000 mL of crystalloid.  COUNTS: Instrument counts were correct.  COMPLICATIONS: No complications.  ANTIBIOTICS: Perioperative antibiotics were given.  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 The patient has failed an extended period of conservative management and desires operative  treatment to eliminate pain and restore function. Informed consent was obtained.  DESCRIPTION OF PROCEDURE: After an adequate level of anesthesia was achieved, the patient was positioned in the modified beach chair position. The right shoulder was correctly identified and sterile prep and drape were performed. Timeout called verifying  correct patient and correct site. We entered the patient's shoulder using a standard deltopectoral incision starting at the coracoid process and extending down to the anterior humerus. Dissection down through subcutaneous tissue using a Bovie. Cephalic  vein was identified and taken laterally. The deltopectoral was taken medially. The  conjoint tendon was identified and retracted medially. Deep retractor was placed. We tenodesed the biceps in situ with 0 Vicryl figure-of-eight suture. We then released  the subscapularis subperiosteally off the lesser tuberosity. We did not plan to repair this tendon, but we wanted to tag it for protection of the axillary nerve. We next released the inferior capsule extending the shoulder and delivering the humeral head  out of the wound. There was bone-on-bone wear with eburnated bone exposed and no cartilage. We entered the proximal humerus with a 6-mm reamer reaming up to a size 8. The patient did have a significant tapering of her canal distally. We placed an 8-mm  T-handle guide and resected the head at 20 degrees of retroversion with the oscillating saw. We irrigated thoroughly and removed excess osteophytes with a rongeur. We then subluxed the humerus posteriorly, getting good exposure of the glenoid. We placed  our deep retractors. We removed the capsule, labrum, and biceps stump. We then found our center point for the guide pin for the reaming for the metaglene baseplate. We placed that guide pin bicortically. We then reamed down to subchondral bone. We then  did a peripheral T-handle reamer and then drilled out our central peg hole. Next, we irrigated and then impacted the HA-coated press-fit baseplate into position. We placed a 30 screw inferiorly, a 36 screw superiorly, and just used two screws. We had  good screw purchase and baseplate support. Then, we selected a 38 +0 standard glenosphere and attached that to the baseplate. Next, I did a finger sweep to make sure we had no soft tissue caught up between the baseplate and the glenosphere. We tightened  the glenosphere down onto the baseplate with a screwdriver. We then went to the humeral side and reamed to the one right metaphysis. We then trialed with the 8 stem and the one right metaphysis set on the 0 setting  and placed in 20 degrees of   retroversion. We selected a 38 +3 poly trial and placed on the humeral tray and reduced the shoulder. Nice little pop as it reduced, very stable throughout a full arc of motion. We irrigated thoroughly. We then went ahead and removed the trial  components. We did remove some anchors and suture from the humerus. After we irrigated well, I used some vanc powder in the canal. We then impacted the [place] 8 stem press fit and then the one right HA-coated metaphysis set in the 0 setting and, using  available bone graft from the humeral head and impaction grafting technique, we impacted that stem into place. The stem went in very well and was very stable. We then went ahead and selected the real 38 +3 poly, placed on the humeral tray, and impacted  that to lock it in place. We then reduced the shoulder. Nice little pop as it reduced and excellent stability throughout a full arc of motion. After we irrigated thoroughly and resected the subscapularis remnant, we placed the remaining vancomycin   powder, 1 g, into the wound deeply. We then repaired the deltopectoral interval over the top of that with 0 Vicryl suture followed by 2-0 Vicryl for subcutaneous closure and 4-0 Monocryl for skin. Steri-Strips were applied followed by a sterile dressing  and a shoulder sling. The patient tolerated the surgery well.       PAA D: 06/10/2024 11:24:23 am T: 06/11/2024 3:06:00 am  JOB: [id number]/ [id number]

## 2024-06-13 ENCOUNTER — Encounter (HOSPITAL_COMMUNITY): Payer: Self-pay | Admitting: Orthopedic Surgery
